# Patient Record
Sex: Male | Born: 1985 | Race: Black or African American | Hispanic: No | Marital: Single
Health system: Southern US, Community
[De-identification: ages and names within clinical notes are randomized; demographics above are authoritative.]

## PROBLEM LIST (undated history)

## (undated) DIAGNOSIS — Z889 Allergy status to unspecified drugs, medicaments and biological substances status: Secondary | ICD-10-CM

## (undated) DIAGNOSIS — K219 Gastro-esophageal reflux disease without esophagitis: Secondary | ICD-10-CM

## (undated) DIAGNOSIS — J40 Bronchitis, not specified as acute or chronic: Secondary | ICD-10-CM

## (undated) HISTORY — PX: ADENOIDECTOMY: SUR15

## (undated) HISTORY — PX: HERNIA REPAIR: SHX51

## (undated) HISTORY — PX: OTHER SURGICAL HISTORY: SHX169

---

## 2012-03-03 ENCOUNTER — Emergency Department (HOSPITAL_COMMUNITY): Payer: Self-pay

## 2012-03-03 ENCOUNTER — Emergency Department (HOSPITAL_COMMUNITY)
Admission: EM | Admit: 2012-03-03 | Discharge: 2012-03-03 | Disposition: A | Discharge status: Home / Self Care | Payer: Self-pay | Attending: Emergency Medicine | Admitting: Emergency Medicine

## 2012-03-03 ENCOUNTER — Encounter (HOSPITAL_COMMUNITY): Payer: Self-pay | Admitting: *Deleted

## 2012-03-03 DIAGNOSIS — R0602 Shortness of breath: Secondary | ICD-10-CM | POA: Insufficient documentation

## 2012-03-03 DIAGNOSIS — T754XXA Electrocution, initial encounter: Secondary | ICD-10-CM | POA: Insufficient documentation

## 2012-03-03 DIAGNOSIS — W868XXA Exposure to other electric current, initial encounter: Secondary | ICD-10-CM | POA: Insufficient documentation

## 2012-03-03 HISTORY — DX: Bronchitis, not specified as acute or chronic: J40

## 2012-03-03 LAB — BASIC METABOLIC PANEL
BUN: 11 mg/dL (ref 6–23)
Chloride: 102 mEq/L (ref 96–112)
Creatinine, Ser: 1.27 mg/dL (ref 0.50–1.35)
GFR calc Af Amer: 88 mL/min — ABNORMAL LOW (ref >90)
Glucose, Bld: 102 mg/dL — ABNORMAL HIGH (ref 70–99)
Potassium: 3.8 mEq/L (ref 3.5–5.1)

## 2012-03-03 MED ORDER — SODIUM CHLORIDE 0.9 % IV BOLUS (SEPSIS)
1000.0000 mL | Freq: Once | INTRAVENOUS | Status: AC
  Administered 2012-03-03: 1000 mL via INTRAVENOUS

## 2012-03-03 MED ORDER — NAPROXEN 500 MG PO TABS: 500.0000 mg | ORAL_TABLET | Freq: Two times a day (BID) | ORAL | Status: AC

## 2012-03-03 MED ORDER — LORAZEPAM 1 MG PO TABS: 1.0000 mg | ORAL_TABLET | Freq: Three times a day (TID) | ORAL | Status: AC | PRN

## 2012-03-03 NOTE — Discharge Instructions (Signed)
Electric Shock Injury  Electric shock injuries may be caused by lightning or electricity (current) passing through the body. The amount of injury depends on the current's pressure (voltage), the amount of current (amperage), the type of current (direct vs. alternating), the body's resistance to the current, the current's path through the body, and how long the body remains in contact with the current. Current is the flow of electricity. Electricity may produce effects ranging from barely noticeable tingling to instant death; every part of the body is vulnerable.   The harshness of injury depends mostly on the voltage. Low voltage can be as dangerous as high voltage under the right circumstances. People have been killed by shocks of just 50 volts.  WHAT DETERMINES THE EFFECTS OF ELECTRICITY?  How electric shocks affect the skin is determined by the skin's resistance. This is the skin's ability to stay unharmed by a shock. This, in turn, depends upon the wetness, dryness, thickness and or cleanliness of the skin. Thin or wet skin is much less resistant than thick or dry skin. When skin resistance is low, the current may cause little or no skin damage but may severely burn internal organs and tissues. Conversely, high skin resistance, such as with dry thick skin, can produce severe skin burns but decreases the current entering the body.  WHAT PARTS OF THE BODY DOES ELECTRICITY AFFECT THE MOST?   The nervous system (the brain, spinal cord, and nerves) are most helpless to the effects of electricity and most often harmed in electrical injury. Some damage is minor and clears up on its own or with treatment. Sometimes the damage is severe and will be permanent. Neurological problems may be apparent immediately after the accident, or gradually develop over a period of up to three years.   Damage to the respiratory and cardiovascular systems happens immediately. Electric shocks can paralyze the respiratory system (stop  breathing) or disrupt heart action (cause the heart to beat irregularly or stop). This may cause instant death. Smaller veins and arteries, which get hot more easily than the larger blood vessels, are at greater risk. They can develop blood clots. Damage to the smaller vessels is a common cause of amputation following high-voltage injuries.   Other injuries may include cataracts, kidney failure, and injury to muscle tissue. An electric arc may set clothing and flammable substances on fire which may cause burns. Strong shocks are often accompanied by violent muscle spasms that can break and dislocate bones. These spasms can also freeze the victim in place and prevent him or her from breaking away from the current.  DIAGNOSIS   Diagnosis relies on information about the cause of the accident, physical examination, and close monitoring of the heart, lungs, neurological condition and kidney activity. These conditions can change rapidly so close observation is necessary. Magnetic resonance imaging (MRI) may be necessary to check for brain injury.  TREATMENT    When an electrical accident happens at home or in the workplace, emergency medical help should be summoned as quickly as possible. The main power should immediately be shut off. If that cannot be done, and current is still flowing through the victim, stand on a dry, non-conducting surface such as a folded newspaper, flattened cardboard carton, or plastic or rubber mat. Use a non-conducting object such as a wooden broomstick (never a damp or metallic object) to push the victim away from the source of the current. Non-conducting means the substance will not pass electricity easily through it. Do not touch   the victim or electrical source while the current is still flowing. This may electrocute the rescuer.   If the victim is faint, pale, or showing signs of shock, lay the victim down, with the legs elevated above the level of the chest. Warm the person with a  blanket.   If a pulse can not be felt, or the person is not breathing, someone trained in cardiopulmonary resuscitation (CPR) should begin CPR. Continue this until help arrives.   If the victim is burned, remove clothing that comes off easily. Rinse the burned area in cool water for pain relief. Give first aid for burns. Burns often require treatment at a burn center.   Electrical injury can be associated with explosions or falls that can cause other injuries. Avoid moving the head or neck if an injury to this area is suspected.   Give first aid as needed for other wounds or fractures.   Fluid replacement therapy is necessary to restore lost fluids and electrolytes. Severely injured tissue is repaired surgically.   Antibiotics and antibacterial creams are used to prevent infection.   Kidney failure may need to be treated.   Physical therapy may help recovery along with counseling if there is disfigurement.  PROGNOSIS    Electric shocks may cause death.   Survivors may require amputation. Cosmetic problems may result along with disfigurement.   Injuries from household appliances and other low-voltage sources are less likely to produce extreme damage.  PREVENTION    Know electrical dangers in your home.   Damaged electric appliances, wiring, cords, and plugs should be repaired or replaced. Electrical repairs should be attempted only by people with the proper training.   Hair dryers, radios, and other electric appliances should never be used in the bathroom or anywhere else they might accidentally come in contact with water. Water and pipes create a ground and the electricity picks the easiest way to go to ground which can be through your body.   Young children need to be kept away from electric appliances and should be taught about the dangers of electricity as soon as they are old enough.   Electric outlets require safety covers in homes with young children.   During lightning and thunder storms, go  indoors immediately, even if no rain is falling. Boaters should return to shore as rapidly as possible.   If the hair on your head or arms stands on end during a storm, seek cover as rapidly as possible as a lightning strike may be about to happen.   If you cannot reach indoor shelter, stay away from metallic objects such as golf clubs or fishing rods and lie down in low-ground areas. Standing or lying under or next to tall or metallic structures is unsafe. For example, it is unsafe to stand under a tree during a lightning storm. Do not stand next to long conductors of electricity such as wire fences.   An automobile is appropriate cover, as long as the radio is off.   Telephones, computers, hair dryers, and other appliances that can act as channels for lightning should not be used during a thunder storm.   During storms, stay away from screens and metal that may conduct electricity from the outside.  SEEK IMMEDIATE MEDICAL CARE IF:   You develop chest pain.   A part of your arms or legs becomes very swollen or painful.   One of your arms or legs appears pale, cool, or discolored.   Your urine becomes discolored,   or you are not urinating as much as usual.   You develop severe abdominal pain.  Document Released: 10/06/2003 Document Revised: 09/22/2011 Document Reviewed: 11/06/2008  [employer] Patient Information 2012 [employer].

## 2012-03-03 NOTE — ED Notes (Signed)
Sent ISTAT to lab by accident.  Spoke with lab, they are changing it to a bmet.
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
## 2013-09-29 ENCOUNTER — Encounter (HOSPITAL_COMMUNITY): Payer: Self-pay | Admitting: Emergency Medicine

## 2013-09-29 ENCOUNTER — Emergency Department (HOSPITAL_COMMUNITY)
Admission: EM | Admit: 2013-09-29 | Discharge: 2013-09-29 | Disposition: A | Discharge status: Home / Self Care | Payer: No Typology Code available for payment source | Attending: Emergency Medicine | Admitting: Emergency Medicine

## 2013-09-29 DIAGNOSIS — J069 Acute upper respiratory infection, unspecified: Secondary | ICD-10-CM | POA: Insufficient documentation

## 2013-09-29 DIAGNOSIS — R51 Headache: Secondary | ICD-10-CM | POA: Insufficient documentation

## 2013-09-29 DIAGNOSIS — K219 Gastro-esophageal reflux disease without esophagitis: Secondary | ICD-10-CM | POA: Insufficient documentation

## 2013-09-29 DIAGNOSIS — Z79899 Other long term (current) drug therapy: Secondary | ICD-10-CM | POA: Insufficient documentation

## 2013-09-29 DIAGNOSIS — J3489 Other specified disorders of nose and nasal sinuses: Secondary | ICD-10-CM | POA: Diagnosis present

## 2013-09-29 DIAGNOSIS — Z8709 Personal history of other diseases of the respiratory system: Secondary | ICD-10-CM | POA: Insufficient documentation

## 2013-09-29 DIAGNOSIS — R599 Enlarged lymph nodes, unspecified: Secondary | ICD-10-CM | POA: Insufficient documentation

## 2013-09-29 HISTORY — DX: Gastro-esophageal reflux disease without esophagitis: K21.9

## 2013-09-29 MED ORDER — OXYCODONE-ACETAMINOPHEN 5-325 MG PO TABS: 1.0000 | ORAL_TABLET | Freq: Three times a day (TID) | ORAL | Status: DC | PRN

## 2013-09-29 NOTE — ED Provider Notes (Signed)
MSE was initiated and I personally evaluated the patient and placed orders (if any) at  6:45 AM on September 29, 2013.  The patient appears stable so that the remainder of the MSE may be completed by another provider.  Darlys Gales, MD 09/29/13 [phone]

## 2013-09-29 NOTE — ED Notes (Signed)
Patient presents with c/o right side facial pain that has been going on for about 1 month.  States he thinks it is his sinus

## 2013-09-29 NOTE — ED Provider Notes (Signed)
CSN: [id number]     Arrival date & time 09/29/13  0559 History   First MD Initiated Contact with Patient 09/29/13 [phone]     Chief Complaint  Patient presents with  . Facial Pain   (Consider location/radiation/quality/duration/timing/severity/associated sxs/prior Treatment) Patient is a 27 y.o. male presenting with URI. The history is provided by the patient.  URI Presenting symptoms: congestion   Presenting symptoms: no cough, no fever and no rhinorrhea   Severity:  Moderate Onset quality:  Sudden Duration:  4 days Timing:  Constant Progression:  Unchanged Chronicity:  New Relieved by:  Nothing Worsened by:  Nothing tried Ineffective treatments: sudafed. Associated symptoms: sinus pain   Associated symptoms: no headaches and no neck pain     Past Medical History  Diagnosis Date  . Bronchitis   . GERD (gastroesophageal reflux disease)    Past Surgical History  Procedure Laterality Date  . Hernia repair    . Tubes in ears    . Adenoidectomy     History reviewed. No pertinent family history. History  Substance Use Topics  . Smoking status: Passive Smoke Exposure - Never Smoker    Types: Cigarettes  . Smokeless tobacco: Never Used  . Alcohol Use: Yes     Comment: socially    Review of Systems  Constitutional: Negative for fever.  HENT: Positive for congestion. Negative for drooling and rhinorrhea.   Eyes: Negative for pain.  Respiratory: Negative for cough and shortness of breath.   Cardiovascular: Negative for chest pain and leg swelling.  Gastrointestinal: Negative for nausea, vomiting, abdominal pain and diarrhea.  Genitourinary: Negative for dysuria and hematuria.  Musculoskeletal: Negative for gait problem and neck pain.  Skin: Negative for color change.  Neurological: Negative for numbness and headaches.  Hematological: Negative for adenopathy.  Psychiatric/Behavioral: Negative for behavioral problems.  All other systems reviewed and are  negative.    Allergies  Shellfish allergy  Home Medications   Current Outpatient Rx  Name  Route  Sig  Dispense  Refill  . cetirizine (ZYRTEC) 10 MG tablet   Oral   Take 10 mg by mouth daily as needed for allergies.         Marland Kitchen oxyCODONE-acetaminophen (PERCOCET) 5-325 MG per tablet   Oral   Take 1 tablet by mouth every 8 (eight) hours as needed for moderate pain.   5 tablet   0    BP 164/68  Pulse 76  Temp(Src) 98.3 F (36.8 C) (Oral)  Resp 18  Ht 6\' 3"  (1.905 m)  Wt 230 lb (104.327 kg)  BMI 28.75 kg/m2  SpO2 100% Physical Exam  Nursing note and vitals reviewed. Constitutional: He is oriented to person, place, and time. He appears well-developed and well-nourished.  HENT:  Head: Normocephalic and atraumatic.  Right Ear: External ear normal.  Left Ear: External ear normal.  Nose: Nose normal.  Mouth/Throat: Oropharynx is clear and moist. No oropharyngeal exudate.  Normal appearing posterior oropharynx.  Mild bilateral anterior cervical adenopathy.  Mild tenderness to palpation of the right maxillary sinus.  Normal appearing dentition without periapical or perioral abscess noted.  Eyes: Conjunctivae and EOM are normal. Pupils are equal, round, and reactive to light.  Neck: Normal range of motion. Neck supple.  Cardiovascular: Normal rate, regular rhythm, normal heart sounds and intact distal pulses.  Exam reveals no gallop and no friction rub.   No murmur heard. Pulmonary/Chest: Effort normal and breath sounds normal. No respiratory distress. He has no wheezes.  Abdominal: Soft.  Bowel sounds are normal. He exhibits no distension. There is no tenderness. There is no rebound and no guarding.  Musculoskeletal: Normal range of motion. He exhibits no edema and no tenderness.  Neurological: He is alert and oriented to person, place, and time.  Skin: Skin is warm and dry.  Psychiatric: He has a normal mood and affect. His behavior is normal.    ED Course  Procedures  (including critical care time) Labs Review Labs Reviewed - No data to display Imaging Review No results found.  EKG Interpretation   None       MDM   1. URI (upper respiratory infection)   2. Sinus pain    7:15 AM 27 y.o. male who presents with nasal congestion and sinus pain for 4 days. He denies any fever, vomiting, diarrhea. He appears well on exam. He is complaining of right maxillary sinus pain. Will recommend over-the-counter therapies and will provide several tablets of Percocet so the patient can sleep at night.  7:17 AM:  I have discussed the diagnosis/risks/treatment options with the patient and believe the pt to be eligible for discharge home to follow-up with pcp as needed. We also discussed returning to the ED immediately if new or worsening sx occur. We discussed the sx which are most concerning (e.g., worsening pain, fever) that necessitate immediate return. Any new prescriptions provided to the patient are listed below.  New Prescriptions   OXYCODONE-ACETAMINOPHEN (PERCOCET) 5-325 MG PER TABLET    Take 1 tablet by mouth every 8 (eight) hours as needed for moderate pain.       Junius Argyle, MD 09/29/13 [phone]

## 2013-10-06 DIAGNOSIS — R599 Enlarged lymph nodes, unspecified: Secondary | ICD-10-CM | POA: Insufficient documentation

## 2013-10-06 DIAGNOSIS — Z8719 Personal history of other diseases of the digestive system: Secondary | ICD-10-CM | POA: Insufficient documentation

## 2013-10-06 DIAGNOSIS — R51 Headache: Secondary | ICD-10-CM | POA: Insufficient documentation

## 2013-10-06 DIAGNOSIS — Z8709 Personal history of other diseases of the respiratory system: Secondary | ICD-10-CM | POA: Insufficient documentation

## 2013-10-06 DIAGNOSIS — J3489 Other specified disorders of nose and nasal sinuses: Secondary | ICD-10-CM | POA: Insufficient documentation

## 2013-10-07 ENCOUNTER — Emergency Department (HOSPITAL_COMMUNITY): Payer: No Typology Code available for payment source

## 2013-10-07 ENCOUNTER — Emergency Department (HOSPITAL_COMMUNITY)
Admission: EM | Admit: 2013-10-07 | Discharge: 2013-10-07 | Disposition: A | Discharge status: Home / Self Care | Payer: No Typology Code available for payment source | Attending: Emergency Medicine | Admitting: Emergency Medicine

## 2013-10-07 ENCOUNTER — Encounter (HOSPITAL_COMMUNITY): Payer: Self-pay | Admitting: Emergency Medicine

## 2013-10-07 MED ORDER — OXYCODONE-ACETAMINOPHEN 5-325 MG PO TABS: 1.0000 | ORAL_TABLET | ORAL | Status: DC | PRN

## 2013-10-07 MED ORDER — AMOXICILLIN-POT CLAVULANATE 875-125 MG PO TABS: 1.0000 | ORAL_TABLET | Freq: Two times a day (BID) | ORAL | Status: DC

## 2013-10-07 MED ORDER — OXYCODONE-ACETAMINOPHEN 5-325 MG PO TABS
1.0000 | ORAL_TABLET | Freq: Once | ORAL | Status: AC
  Administered 2013-10-07: 1 via ORAL
  Filled 2013-10-07: qty 1

## 2013-10-07 NOTE — ED Notes (Signed)
Pt. reports right facial pain / sinus pressure for several days , seen here 09/29/2013 prescribed with pain medication with no relief.

## 2013-10-07 NOTE — ED Provider Notes (Signed)
CSN: [id number]     Arrival date & time 10/06/13  2346 History   None    Chief Complaint  Patient presents with  . Facial Pain   (Consider location/radiation/quality/duration/timing/severity/associated sxs/prior Treatment) HPI History provided by pt.   Pt has had gradually worsening pain in the right side of his face for the past two weeks.  Currently intolerable.  Aggravated by chewing on right side as well as palpation.  Associated w/ mild nasal congestion.  Denies fever, sore throat, edema.   Past Medical History  Diagnosis Date  . Bronchitis   . GERD (gastroesophageal reflux disease)    Past Surgical History  Procedure Laterality Date  . Hernia repair    . Tubes in ears    . Adenoidectomy     No family history on file. History  Substance Use Topics  . Smoking status: Passive Smoke Exposure - Never Smoker    Types: Cigarettes  . Smokeless tobacco: Never Used  . Alcohol Use: Yes     Comment: socially    Review of Systems  All other systems reviewed and are negative.    Allergies  Shellfish allergy  Home Medications   Current Outpatient Rx  Name  Route  Sig  Dispense  Refill  . cetirizine (ZYRTEC) 10 MG tablet   Oral   Take 10 mg by mouth daily as needed for allergies.         Marland Kitchen oxyCODONE-acetaminophen (PERCOCET) 5-325 MG per tablet   Oral   Take 1 tablet by mouth every 8 (eight) hours as needed for moderate pain.   5 tablet   0    BP 140/74  Pulse 76  Temp(Src) 97.5 F (36.4 C) (Oral)  Resp 18  SpO2 100% Physical Exam  Nursing note and vitals reviewed. Constitutional: He is oriented to person, place, and time. He appears well-developed and well-nourished. No distress.  HENT:  Head: Normocephalic and atraumatic.  Nml dentition w/ exception of tenderness of R upper 1st and 2nd premolars.  Injection of tonsils and posterior pharynx w/out exudate.  Uvula mid-line.  No trismus.  No submandibular edema.  Tenderness of entire R maxilla and mandible,  including TMJ.  Slight edema inferior to R eye.  No skin changes.   Eyes:  Normal appearance  Neck: Normal range of motion.  Pulmonary/Chest: Effort normal.  Musculoskeletal: Normal range of motion.  Lymphadenopathy:    He has cervical adenopathy.  Neurological: He is alert and oriented to person, place, and time.  Psychiatric: He has a normal mood and affect. His behavior is normal.    ED Course  Procedures (including critical care time) Labs Review Labs Reviewed - No data to display Imaging Review Ct Maxillofacial Wo Cm  10/07/2013   CLINICAL DATA:  Right facial pain and sinus pressure for several days.  EXAM: CT MAXILLOFACIAL WITHOUT CONTRAST  TECHNIQUE: Multidetector CT imaging of the maxillofacial structures was performed. Multiplanar CT image reconstructions were also generated. A small metallic BB was placed on the right temple in order to reliably differentiate right from left.  COMPARISON:  None.  FINDINGS: There is no evidence of fracture or dislocation. The maxilla and mandible appear intact. The nasal bone is unremarkable in appearance. Dental caries are seen at the right second maxillary premolar and left first maxillary premolar.  The orbits are intact bilaterally. A mucus retention cyst or polyp is noted within the left maxillary sinus. The remaining visualized paranasal sinuses and mastoid air cells are well-aerated.  No  significant soft tissue abnormalities are seen. The parapharyngeal fat planes are preserved. The nasopharynx, oropharynx and hypopharynx are unremarkable in appearance. The visualized portions of the valleculae and piriform sinuses are grossly unremarkable.  The parotid and submandibular glands are within normal limits. No cervical lymphadenopathy is seen. The visualized portions of the brain are unremarkable in appearance.  IMPRESSION: 1. No evidence of fracture or dislocation. 2. Dental caries noted at the right second maxillary premolar and the left first  maxillary premolar. 3. Mucus retention cyst or polyp within the left maxillary sinus.   Electronically Signed   By: Roanna Raider M.D.   On: 10/07/2013 02:40    EKG Interpretation   None       MDM   1. Facial pain    27yo healthy M presents w/ 2 weeks gradually worsening, non-traumatic R facial pain.  Was evaluated in ED on 10/01/13, diagnosed w/ sinusitis and d/c'd home w/ percocet.  Returns today because pain severe.  On exam, afebrile, uncomfortable appearing, Nml appearance 1st and 2nd right upper premolars but both ttp, tenderness of R maxilla and mandible and cervical lymphadenopathy.  Because of course of sx and b/c it is unclear whether pain is originating at sinus or dentition, CT maxillofacial ordered.  Pt has h/o dental abscess.  He has received one percocet for pain.  1:29 AM   CT shows dental caries right upper second premolar.  This may be source of pain.  Prescribed augmentin to cover both dental infection and possible sinusitis.  He also received 12 more percocet and I recommended sudafed and motrin.  Referred to dentist on call.  Return precautions discussed.    Otilio Miu, PA-C 10/07/13 [phone]

## 2013-10-11 NOTE — ED Provider Notes (Signed)
Medical screening examination/treatment/procedure(s) were performed by non-physician practitioner and as supervising physician I was immediately available for consultation/collaboration.     Brandt Loosen, MD 10/11/13 [phone]

## 2014-01-11 ENCOUNTER — Emergency Department (INDEPENDENT_AMBULATORY_CARE_PROVIDER_SITE_OTHER)
Admission: EM | Admit: 2014-01-11 | Discharge: 2014-01-11 | Disposition: A | Discharge status: Home / Self Care | Payer: No Typology Code available for payment source | Source: Home / Self Care | Attending: Emergency Medicine | Admitting: Emergency Medicine

## 2014-01-11 ENCOUNTER — Encounter (HOSPITAL_COMMUNITY): Payer: Self-pay | Admitting: Emergency Medicine

## 2014-01-11 DIAGNOSIS — J029 Acute pharyngitis, unspecified: Secondary | ICD-10-CM

## 2014-01-11 LAB — POCT RAPID STREP A: STREPTOCOCCUS, GROUP A SCREEN (DIRECT): NEGATIVE

## 2014-01-11 MED ORDER — METHYLPREDNISOLONE ACETATE 80 MG/ML IJ SUSP
INTRAMUSCULAR | Status: AC
  Filled 2014-01-11: qty 1

## 2014-01-11 MED ORDER — METHYLPREDNISOLONE ACETATE 80 MG/ML IJ SUSP
80.0000 mg | Freq: Once | INTRAMUSCULAR | Status: AC
  Administered 2014-01-11: 80 mg via INTRAMUSCULAR

## 2014-01-11 MED ORDER — AMOXICILLIN 500 MG PO TABS: 500.0000 mg | ORAL_TABLET | Freq: Two times a day (BID) | ORAL | Status: DC

## 2014-01-11 MED ORDER — IBUPROFEN 800 MG PO TABS: 800.0000 mg | ORAL_TABLET | Freq: Three times a day (TID) | ORAL | Status: DC | PRN

## 2014-01-11 NOTE — Discharge Instructions (Signed)
Pharyngitis °Pharyngitis is a sore throat (pharynx). There is redness, pain, and swelling of your throat. °HOME CARE  °· Drink enough fluids to keep your pee (urine) clear or pale yellow. °· Only take medicine as told by your doctor. °· You may get sick again if you do not take medicine as told. Finish your medicines, even if you start to feel better. °· Do not take aspirin. °· Rest. °· Rinse your mouth (gargle) with salt water (½ tsp of salt per 1 qt of water) every 1 2 hours. This will help the pain. °· If you are not at risk for choking, you can suck on hard candy or sore throat lozenges. °GET HELP IF: °· You have large, tender lumps on your neck. °· You have a rash. °· You cough up green, yellow-brown, or bloody spit. °GET HELP RIGHT AWAY IF:  °· You have a stiff neck. °· You drool or cannot swallow liquids. °· You throw up (vomit) or are not able to keep medicine or liquids down. °· You have very bad pain that does not go away with medicine. °· You have problems breathing (not from a stuffy nose). °MAKE SURE YOU:  °· Understand these instructions. °· Will watch your condition. °· Will get help right away if you are not doing well or get worse. °Document Released: 03/21/2008 Document Revised: 07/24/2013 Document Reviewed: 06/10/2013 °[employer]® Patient Information ©2014 [employer]. ° °

## 2014-01-11 NOTE — ED Provider Notes (Signed)
Medical screening examination/treatment/procedure(s) were performed by a resident physician and as supervising physician I was immediately available for consultation/collaboration.  Leslee Homeavid Arin Vanosdol, M.D.  Reuben Likesavid C Katana Berthold, MD 01/11/14 2149

## 2014-01-11 NOTE — ED Provider Notes (Signed)
CSN: [id number]     Arrival date & time 01/11/14  1905 History   First MD Initiated Contact with Patient 01/11/14 2010     No chief complaint on file.  (Consider location/radiation/quality/duration/timing/severity/associated sxs/prior Treatment) HPI Patient is a 28yo M presenting with URI-like symptoms. He reports throat pain and swelling. Decreased appetite. No fevers at home. Started one week ago with throat discomfort, progressively worsening. No known sick contacts. Reports productive cough (clear phlegm), nasal congestion, sneezing. Has history of seasonal allergies, takes claritin. No SOB, able to swallow liquids. No lip or tongue swelling.   Past Medical History  Diagnosis Date  . Bronchitis   . GERD (gastroesophageal reflux disease)    Past Surgical History  Procedure Laterality Date  . Hernia repair    . Tubes in ears    . Adenoidectomy     No family history on file. History  Substance Use Topics  . Smoking status: Passive Smoke Exposure - Never Smoker    Types: Cigarettes  . Smokeless tobacco: Never Used  . Alcohol Use: Yes     Comment: socially    Review of Systems  Constitutional: Negative for fever and chills.  HENT: Positive for congestion, postnasal drip, sneezing and sore throat.   Eyes: Negative for visual disturbance.  Respiratory: Positive for cough. Negative for shortness of breath.   Cardiovascular: Negative for chest pain and leg swelling.  Gastrointestinal: Negative for abdominal pain.  Genitourinary: Negative for dysuria.  Musculoskeletal: Negative for arthralgias and myalgias.  Skin: Negative for rash.  Neurological: Negative for headaches.    Allergies  Bee venom and Shellfish allergy  Home Medications   Current Outpatient Rx  Name  Route  Sig  Dispense  Refill  . amoxicillin (AMOXIL) 500 MG tablet   Oral   Take 1 tablet (500 mg total) by mouth 2 (two) times daily.   20 tablet   0   . amoxicillin-clavulanate (AUGMENTIN) 875-125 MG per  tablet   Oral   Take 1 tablet by mouth every 12 (twelve) hours.   14 tablet   0   . cetirizine (ZYRTEC) 10 MG tablet   Oral   Take 10 mg by mouth daily as needed for allergies.         Marland Kitchen. ibuprofen (ADVIL,MOTRIN) 800 MG tablet   Oral   Take 1 tablet (800 mg total) by mouth every 8 (eight) hours as needed.   30 tablet   0   . oxyCODONE-acetaminophen (PERCOCET) 5-325 MG per tablet   Oral   Take 1 tablet by mouth every 8 (eight) hours as needed for moderate pain.   5 tablet   0   . oxyCODONE-acetaminophen (PERCOCET/ROXICET) 5-325 MG per tablet   Oral   Take 1 tablet by mouth every 4 (four) hours as needed for severe pain.   12 tablet   0    BP 152/69  Pulse 85  Temp(Src) 99.3 F (37.4 C) (Oral)  Resp 18 Physical Exam  Constitutional: He is oriented to person, place, and time. He appears well-developed and well-nourished. No distress.  HENT:  Head: Normocephalic and atraumatic.  Mouth/Throat: Oropharyngeal exudate (Posterior pharynx very erythematous with edema. 2+ tonsils with white exudate on right) present.  Eyes: Pupils are equal, round, and reactive to light.  Neck: Normal range of motion. Neck supple.  Cardiovascular: Normal rate, regular rhythm and normal heart sounds.   No murmur heard. Pulmonary/Chest: Effort normal and breath sounds normal. He has no wheezes.  Abdominal: Soft.  He exhibits no distension. There is no tenderness.  Musculoskeletal: Normal range of motion. He exhibits no edema and no tenderness.  Neurological: He is alert and oriented to person, place, and time.  Skin: Skin is warm and dry.  Psychiatric: He has a normal mood and affect.    ED Course  Procedures (including critical care time) Labs Review Labs Reviewed  POCT RAPID STREP A ([facility] ONLY)   Imaging Review No results found.  MDM   1. Pharyngitis    Strep test negative. Patient with significant discomfort due to swelling and erythema - Depomedrol 80mg  given in  clinic - Ibuprofen 800mg  Rx  - Amox 500mg  BID Rx given to patient with the option to fill it if he does not get relief with steroid - F/u if anything worsens, or if he fails to improve    Hilarie Fredrickson, MD 01/11/14 2042

## 2014-01-11 NOTE — ED Notes (Signed)
Sore throat onset Tues. Has a little runny nose and coughing.  No fever noted.

## 2014-01-13 LAB — CULTURE, GROUP A STREP

## 2014-03-14 ENCOUNTER — Encounter (HOSPITAL_COMMUNITY): Payer: Self-pay | Admitting: Emergency Medicine

## 2014-03-14 ENCOUNTER — Emergency Department (HOSPITAL_COMMUNITY)
Admission: EM | Admit: 2014-03-14 | Discharge: 2014-03-14 | Disposition: A | Discharge status: Home / Self Care | Payer: No Typology Code available for payment source | Attending: Emergency Medicine | Admitting: Emergency Medicine

## 2014-03-14 DIAGNOSIS — K219 Gastro-esophageal reflux disease without esophagitis: Secondary | ICD-10-CM | POA: Insufficient documentation

## 2014-03-14 DIAGNOSIS — K130 Diseases of lips: Secondary | ICD-10-CM | POA: Insufficient documentation

## 2014-03-14 DIAGNOSIS — J309 Allergic rhinitis, unspecified: Secondary | ICD-10-CM | POA: Insufficient documentation

## 2014-03-14 DIAGNOSIS — Z79899 Other long term (current) drug therapy: Secondary | ICD-10-CM | POA: Insufficient documentation

## 2014-03-14 DIAGNOSIS — T7840XA Allergy, unspecified, initial encounter: Secondary | ICD-10-CM

## 2014-03-14 DIAGNOSIS — T478X5A Adverse effect of other agents primarily affecting gastrointestinal system, initial encounter: Secondary | ICD-10-CM | POA: Insufficient documentation

## 2014-03-14 MED ORDER — METHYLPREDNISOLONE SODIUM SUCC 125 MG IJ SOLR
125.0000 mg | Freq: Once | INTRAMUSCULAR | Status: AC
  Administered 2014-03-14: 125 mg via INTRAVENOUS
  Filled 2014-03-14: qty 2

## 2014-03-14 MED ORDER — FAMOTIDINE 20 MG PO TABS: 20.0000 mg | ORAL_TABLET | Freq: Two times a day (BID) | ORAL | Status: DC

## 2014-03-14 MED ORDER — FAMOTIDINE IN NACL 20-0.9 MG/50ML-% IV SOLN
20.0000 mg | Freq: Once | INTRAVENOUS | Status: AC
  Administered 2014-03-14: 20 mg via INTRAVENOUS
  Filled 2014-03-14: qty 50

## 2014-03-14 MED ORDER — EPINEPHRINE 0.3 MG/0.3ML IJ SOAJ
INTRAMUSCULAR | Status: AC
  Administered 2014-03-14: 0.3 mg
  Filled 2014-03-14: qty 0.3

## 2014-03-14 MED ORDER — DIPHENHYDRAMINE HCL 25 MG PO TABS: 25.0000 mg | ORAL_TABLET | Freq: Four times a day (QID) | ORAL | Status: DC | PRN

## 2014-03-14 MED ORDER — PREDNISONE 20 MG PO TABS: ORAL_TABLET | ORAL | Status: DC

## 2014-03-14 MED ORDER — DIPHENHYDRAMINE HCL 50 MG/ML IJ SOLN
25.0000 mg | Freq: Once | INTRAMUSCULAR | Status: AC
  Administered 2014-03-14: 25 mg via INTRAVENOUS
  Filled 2014-03-14: qty 1

## 2014-03-14 NOTE — ED Notes (Signed)
Given patient water for fluid challenge

## 2014-03-14 NOTE — ED Provider Notes (Signed)
Patient originally seen by Dr. Theodoro Grist - transfer of care from Dr. Theodoro Grist at change in shift.   Plan: Anticipate discharge.   Roberto Moody  is a 28 y/o M with PMHx of GERD and bronchitis presenting to the ED with an allergic reaction. Patient reported that he has been taking omeprazole OTC for the past week - 20 mg tablets daily. Reported that he took his last dose at 11:00AM - reported that approximately one hour after taking the medication he began to have upper lip swelling, mainly to the right upper aspect and dry mouth. Stated that he drank a lot of water and stated that he did not feel any different. Reported that he started to feel mildly short of breath and reported that he came to the ED. Patient reported that he was not aware that Prilosec and omeprazole were one in the same - reported that years ago he got short of breath and had a similar reaction with Prilosec. Denied nausea, vomiting, chest pain, difficulty breathing, tingling. Denied use of ACE inhibitors.   7:36 AM Alert and oriented. GCS 15. Negative signs of respiratory distress-negative use of accessory muscles, negative stridor. Patient stable to speak in full sentences without difficulty. Heart rate and rhythm normal. Pulses palpable and strong-radial and DP 2+ bilaterally. Mild swelling identified to the right aspect of the upper lip with negative erythema, inflammation, lesions sores, warmth upon palpation. Negative tongue swelling identified. Uvula midline with symmetrical elevation-negative uvula deviation or uvula swelling. Lungs clear to auscultation bilaterally.  10:54 AM Patient reassessed by this provider. Patient appears comfortable sitting upright in bed. Negative signs of respiratory distress, negative stridor, negative use of accessory muscles. Swelling to the upper lip has decreased, improved. Patient ambulated well, pulse ox 100% on room air-negative signs of respiratory distress upon motion. Patient able to  tolerate fluids by mouth without difficulty swallowing or emesis while in ED setting. Vitals monitored while in ED setting, appear unremarkable. Patient stable, afebrile. Patient not septic appearing. Patient appears well after IV medications administered. Discharged patient. Discharged patient with Benadryl, Pepcid, and Prednisone small burst. [facility]. Discussed with patient to discontinue both Prilosec and Omeprazole. Discussed with patient to rest and stay hydrated. Discussed with patient to avoid any physical strenuous activity. Discussed with patient if symptoms are to worsen or change such as shortness approximate difficulty breathing, swelling, throat closing sensation, tongue swelling to report back to emergency department immediately. Discussed with patient to closely monitor symptoms and if symptoms are to worsen or change to report back to the ED - strict return instructions given.  Patient agreed to plan of care, understood, all questions answered.   Medications  diphenhydrAMINE (BENADRYL) injection 25 mg (25 mg Intravenous Given 03/14/14 0647)  famotidine (PEPCID) IVPB 20 mg (0 mg Intravenous Stopped 03/14/14 0730)  methylPREDNISolone sodium succinate (SOLU-MEDROL) 125 mg/2 mL injection 125 mg (125 mg Intravenous Given 03/14/14 0645)  EPINEPHrine (EPI-PEN) 0.3 mg/0.3 mL injection (0.3 mg  Given 03/14/14 0633)   Filed Vitals:   03/14/14 0900 03/14/14 0929 03/14/14 0930 03/14/14 1000  BP: 124/50 136/50 136/50 118/57  Pulse: 67 79 71 77  Temp:      TempSrc:      Resp: 16 16 16 16   Height:      Weight:      SpO2: 98% 100% 100% 99%   Diagnoses that have been ruled out:  None  Diagnoses that are still under consideration:  None  Final diagnoses:  Allergic reaction     Raymon MuttonMarissa Alysandra Lobue, PA-C 03/14/14 1107  Gage Weant, PA-C 03/14/14 1108

## 2014-03-14 NOTE — ED Notes (Signed)
Patient tolerated fluid challenge without incident.

## 2014-03-14 NOTE — ED Notes (Signed)
Patient here with possible allergic reaction to Omeprazole. Patient states that he has had trouble with Prilosec in the past, but didn't realize that Prilosec and omeprazole were the same. States that his upper lip is swollen and throat feels sore.

## 2014-03-14 NOTE — Discharge Instructions (Signed)
Please call and set-up an appointment with Health and [place]  Please call and set-up an appointment with Allergy to get tested Please use medications as prescribed - while on Benadryl can cause drowsiness, please do not take when operating heavy machinery, driving, alcohol. Please no more than 100 mg per day.  Please rest and stay hydrated Please STOP Prilosec and Omeprazole Please continue to monitor symptoms closely and if symptoms are to worsen or change (fever greater than 101, chills, sweating, nausea, vomiting, chest pain, shortness of breath, difficulty breathing, numbness, tingling, tongue swelling, swelling to the face, throat closing sensation, blurred vision, sudden loss of vision, worsening or changes to symptoms, dizziness, light-headedness) please report back to the ED immediately     Drug Allergy Allergic reactions to medicines are common. Some allergic reactions are mild. A delayed type of drug allergy that occurs 1 week or more after exposure to a medicine or vaccine is called serum sickness. A life-threatening, sudden (acute) allergic reaction that involves the whole body is called anaphylaxis. CAUSES  "True" drug allergies occur when there is an allergic reaction to a medicine. This is caused by overactivity of the immune system. First, the body becomes sensitized. The immune system is triggered by your first exposure to the medicine. Following this first exposure, future exposure to the same medicine may be life-threatening. Almost any medicine can cause an allergic reaction. Common ones are:  Penicillin.  Sulfonamides (sulfa drugs).  Local anesthetics.  X-ray dyes that contain iodine. SYMPTOMS  Common symptoms of a minor allergic reaction are:  Swelling around the mouth.  An itchy red rash or hives.  Vomiting or diarrhea. Anaphylaxis can cause swelling of the mouth and throat. This makes it difficult to breathe and swallow. Severe reactions can be fatal  within seconds, even after exposure to only a trace amount of the drug that causes the reaction. HOME CARE INSTRUCTIONS   If you are unsure of what caused your reaction, keep a diary of foods and medicines used. Include the symptoms that followed. Avoid anything that causes reactions.  You may want to follow up with an allergy specialist after the reaction has cleared in order to be tested to confirm the allergy. It is important to confirm that your reaction is an allergy, not just a side effect to the medicine. If you have a true allergy to a medicine, this may prevent that medicine and related medicines from being given to you when you are very ill.  If you have hives or a rash:  Take medicines as directed by your caregiver.  You may use an over-the-counter antihistamine (diphenhydramine) as needed.  Apply cold compresses to the skin or take baths in cool water. Avoid hot baths or showers.  If you are severely allergic:  Continuous observation after a severe reaction may be needed. Hospitalization is often required.  Wear a medical alert bracelet or necklace stating your allergy.  You and your family must learn how to use an anaphylaxis kit or give an epinephrine injection to temporarily treat an emergency allergic reaction. If you have had a severe reaction, always carry your epinephrine injection or anaphylaxis kit with you. This can be lifesaving if you have a severe reaction.  Do not drive or perform tasks after treatment until the medicines used to treat your reaction have worn off, or until your caregiver says it is okay. SEEK MEDICAL CARE IF:   You think you had an allergic reaction. Symptoms usually start within 30  minutes after exposure.  Symptoms are getting worse rather than better.  You develop new symptoms.  The symptoms that brought you to your caregiver return. SEEK IMMEDIATE MEDICAL CARE IF:   You have swelling of the mouth, difficulty breathing, or  wheezing.  You have a tight feeling in your chest or throat.  You develop hives, swelling, or itching all over your body.  You develop severe vomiting or diarrhea.  You feel faint or pass out. This is an emergency. Use your epinephrine injection or anaphylaxis kit as you have been instructed. Call for emergency medical help. Even if you improve after the injection, you need to be examined at a hospital emergency department. MAKE SURE YOU:   Understand these instructions.  Will watch your condition.  Will get help right away if you are not doing well or get worse. Document Released: 10/03/2005 Document Revised: 12/26/2011 Document Reviewed: 03/09/2011 [facility] Patient Information 2014 [place], [place].   Emergency Department Resource Guide 1) Find a Doctor and Pay Out of Pocket Although you won't have to find out who is covered by your insurance plan, it is a good idea to ask around and get recommendations. You will then need to call the office and see if the doctor you have chosen will accept you as a new patient and what types of options they offer for patients who are self-pay. Some doctors offer discounts or will set up payment plans for their patients who do not have insurance, but you will need to ask so you aren't surprised when you get to your appointment.  2) Contact Your Local Health Department Not all health departments have doctors that can see patients for sick visits, but many do, so it is worth a call to see if yours does. If you don't know where your local health department is, you can check in your phone book. The CDC also has a tool to help you locate your state's health department, and many state websites also have listings of all of their local health departments.  3) Find a [facility] If your illness is not likely to be very severe or complicated, you may want to try a walk in clinic. These are popping up all over the country in pharmacies, drugstores, and shopping  centers. They're usually staffed by nurse practitioners or physician assistants that have been trained to treat common illnesses and complaints. They're usually fairly quick and inexpensive. However, if you have serious medical issues or chronic medical problems, these are probably not your best option.  No Primary Care Doctor: - Call Health Connect at  [phone] - they can help you locate a primary care doctor that  accepts your insurance, provides certain services, etc. - Physician Referral Service- [phone]  Chronic Pain Problems: Organization         Address  Phone   Notes  [facility]  [phone] Patients need to be referred by their primary care doctor.   Medication Assistance: Organization         Address  Phone   Notes  [facility] Medication [place] [facility] [address] [place], [place] [address] [phone] --Must be a resident of [facility] -- Must have NO insurance coverage whatsoever (no Medicaid/ Medicare, etc.) -- The pt. MUST have a primary care doctor that directs their care regularly and follows them in the community   MedAssist  [phone]   [employer]  [phone]    Agencies that  provide inexpensive medical care: Organization         Address  Phone   Notes  [place]  [phone]   [facility]    [phone]   [facility] [place], [place] [address] [phone]   [place] [address] [place] [phone]   Planned Parenthood    [phone]   [facility]    [phone]   [place] and [place] [address], [place] Phone:  [phone], Fax:  [phone] Hours of Operation:  9 am - 6 pm, M-F.  Also accepts Medicaid/Medicare and self-pay.  [facility] for [place], [address], [place] Phone: [phone], Fax: [phone]. Hours of Operation:  8:30 am - 5:30 pm, M-F.  Also accepts Medicaid and self-pay.  [facility] [place] [address] [place] Phone: [phone]   [place], [place], [place] [phone], Ext. [phone] Mondays & Thursdays: 7-9 AM.  First 15 patients are seen on a first come, first serve basis.    [place] Providers:  Organization         Address  Phone   Notes  [facility] [address], [place] [phone] Also accepts self-pay patients.  [facility] [address], [place]  [phone]   [place], [address], [place] [phone]   [facility] [address], [place] [phone]   Lucianne Lei [address], [place]   [phone] Only accepts [place] Access [place] patients after they have their name applied to their card.   Self-Pay (no insurance) in [facility]:  Organization         Address  Phone   Notes  Sickle Cell Patients, [facility] [place] [phone]   [facility] [place] [phone]   [facility] [place]  [place], [place], [place] [phone]   [facility]/Dr. Osei-Bonsu  [address] [place] or [address], [place] [phone] Phone number for both [place] and [place] locations is the same.  [facility] [address] [place] [phone]   [facility] [address] [place] or [address] [phone]   [facility] [address], [place] [phone], phone; [phone], fax Sees patients 1st and 3rd Saturday of every month.  Must not qualify for public or private insurance (i.e.  Medicaid, Medicare, [place] Health Choice, Veterans' Benefits)  Household income should be no more than 200% of the poverty level The clinic cannot treat you if you are pregnant or think you are pregnant  Sexually transmitted diseases are not treated at the clinic.    Dental Care: Organization         Address  Phone  Notes  [facility] Department of [facility] [place] [phone] Accepts children up to age 28 who are enrolled in [place] or [place]; pregnant women with a Medicaid card; and children who have applied for Medicaid or [place]  Choice, but were declined, whose parents can pay a reduced fee at time of service.  [facility] Department of [facility]  [address], [place] [phone] Accepts children up to age 46 who are enrolled in [place] or [place]; pregnant women with a Medicaid card; and children who have applied for Medicaid or [place] Health Choice, but were declined, whose parents can pay a reduced fee at time of service.  [place] Adult Dental Access PROGRAM  [place] [phone] Patients are seen by appointment only. Walk-ins are not accepted. [place] will see patients 59 years of age and older. Monday - Tuesday (8am-5pm) Most Wednesdays (8:30-5pm) $30 per visit, cash only  [facility] Adult Dental Access PROGRAM  [address], [facility] [phone] Patients are seen by appointment only. Walk-ins are not accepted. [place] will see patients 27 years of age and older. One Wednesday Evening (Monthly: Volunteer Based).  $30 per visit, cash only  [place]  [phone] for adults; Children under age 59, call Graduate Pediatric Dentistry at [phone]. Children aged 47-14, please call [phone] to request a pediatric application.  Dental services are provided in all areas of dental care including fillings,  crowns and bridges, complete and partial dentures, implants, gum treatment, root canals, and extractions. Preventive care is also provided. Treatment is provided to both adults and children. Patients are selected via a lottery and there is often a waiting list.   [facility] [address], [employer]  [phone] [email or web address]   [facility] [address] [place], [place] [phone], Ext. [phone] Second and Fourth Thursday of each month, opens at 6:30 AM; Clinic ends at 9 AM.  Patients are seen on a first-come first-served basis, and a limited number are seen during each clinic.   [facility]  [address] [place] D[place]er [place], [place] [phone]   Eligibility Requirements You must have lived in [place], [place], or [place] counties for at least the last three months.   You cannot be eligible for state or federal sponsored [employer], including [employer], [place], or [employer].   You generally cannot be eligible for healthcare insurance through your employer.    How to apply: Eligibility screenings are held every Tuesday and Wednesday afternoon from 1:00 pm until 4:00 pm. You do not need an appointment for the interview!  [facility] [address], [place], [place]   [place]  Center Department  [place]  [phone]    Behavioral Health Resources in the Community: Intensive Outpatient Programs Organization         Address  Phone  Notes  [place]. [address] [place], [place] [phone]   [facility] Outpatient [address] [place], [place]   ADS: Alcohol & Drug Svcs [address], [place], [place]   [place] [address]  [place], [place] or [phone]   Substance Abuse  Resources Organization         Address  Phone  Notes  Alcohol and Drug Services  [phone]   [facility]  [phone]   [facility] [place]  [phone]   Chinita Pester  [phone]   Residential & Outpatient Substance Abuse Program  [phone]  Psychological Services Organization         Address  Phone  Notes  [facility] [place]  [place]  [phone]   [place] [address], [place] or [phone]    Mobile Crisis Teams Organization         Address  Phone  Notes  [facility], Mobile Crisis Care Unit  [phone]   [facility]  [address] [place], [place]   [place] [address], [place] [phone]    Self-Help/Support Groups Organization         Address  Phone             Notes  [place]. of [place] - variety of support groups  [place] Call for more information  Narcotics Anonymous (NA), [facility]aring [facility] [address], [employer] [place]  2 meetings at this location   Special educational needs teacher         Address  Phone  Notes  [facility] Residential Treatment [place],    [place]  [phone]   [facility]  [address] [place] [address]86, [place], [place]   [place]irley [place], [place] [phone] Admissions: 8am-3pm M-F  Incentives Substance [place] [address]    [place], [place] [phone]   [facility] [address] [employer], [place], [place]   [facility] [address],  [place], [place]   [facility] Programs - Intensive Outpatient [address] Kristeen [address], [place], [place]   [facility] [place].,  [place], [place] [phone] or [phone]   [facility]idential Treatment Services (RTS) [address], [place], [place] Accepts Medicaid  [facility] [place] [address],  [place] [phone] Substance Abuse/Addiction Treatment   [facility] Organization         Address  Phone  Notes  [facility]  [phone]   Domenic Schwab, PhD [address] [place]lis [place], [place]   [phone] or [phone]   [place], [place] [phone]   [facility] [address] [place], [place] [phone] Insurance/Medicaid/sponsorship through [facility] and Families [address], [address] [place]                                    [place], [place] [phone] [place] [address], [place] [phone]    Dr. Adele Schilder  [phone]   [facility] Dept. 1) [address] [place] 2) [place] 3)  [place] [address], [place] [phone]  [phone]   [place] [phone] or [phone] (After Hours)

## 2014-03-14 NOTE — ED Notes (Signed)
Patient ambulated well in hall way without assistance. Pulse Ox levels prior to ambulation 100% and were unchanged during and at completion of ambulation.

## 2014-03-15 NOTE — ED Provider Notes (Signed)
Medical screening examination/treatment/procedure(s) were performed by non-physician practitioner and as supervising physician I was immediately available for consultation/collaboration.   EKG Interpretation None       Hurman Horn, MD 03/15/14 2224

## 2014-03-28 NOTE — ED Provider Notes (Signed)
CSN: [id number]     Arrival date & time 03/14/14  0456 History   First MD Initiated Contact with Patient 03/14/14 [phone]     Chief Complaint  Patient presents with  . Allergic Reaction     (Consider location/radiation/quality/duration/timing/severity/associated sxs/prior Treatment) HPI Comments: Pt with hx of allergic rxn to prilosec comes in with allergic rxn. Reports that he took omeprazole earlier today, and soon after started having swelling of upper lip, and tightness in his throat. Pt has no trouble swallowing, no drooling, and denies any din. No wheezing.  Patient is a 28 y.o. male presenting with allergic reaction. The history is provided by the patient.  Allergic Reaction Presenting symptoms: no rash     Past Medical History  Diagnosis Date  . Bronchitis   . GERD (gastroesophageal reflux disease)    Past Surgical History  Procedure Laterality Date  . Hernia repair    . Tubes in ears    . Adenoidectomy     Family History  Problem Relation Age of Onset  . Cancer Mother     lung   History  Substance Use Topics  . Smoking status: Passive Smoke Exposure - Never Smoker    Types: Cigarettes  . Smokeless tobacco: Never Used  . Alcohol Use: No    Review of Systems  Constitutional: Negative for activity change and appetite change.  Respiratory: Negative for cough and shortness of breath.   Cardiovascular: Negative for chest pain.  Gastrointestinal: Negative for abdominal pain.  Genitourinary: Negative for dysuria.  Skin: Negative for rash.  Allergic/Immunologic: Positive for environmental allergies.      Allergies  Bee venom and Shellfish allergy  Home Medications   Prior to Admission medications   Medication Sig Start Date End Date Taking? Authorizing Provider  cetirizine (ZYRTEC) 10 MG tablet Take 10 mg by mouth daily as needed for allergies.   Yes Historical Provider, MD  ibuprofen (ADVIL,MOTRIN) 800 MG tablet Take 1 tablet (800 mg total) by mouth every 8  (eight) hours as needed. 01/11/14  Yes Amber Nydia BoutonM [place], MD  diphenhydrAMINE (BENADRYL) 25 MG tablet Take 1 tablet (25 mg total) by mouth every 6 (six) hours as needed for allergies. 03/14/14   Marissa Sciacca, PA-C  famotidine (PEPCID) 20 MG tablet Take 1 tablet (20 mg total) by mouth 2 (two) times daily. 03/14/14   Marissa Sciacca, PA-C  predniSONE (DELTASONE) 20 MG tablet 3 tabs po day one, then 2 tabs daily x 4 days 03/14/14   Marissa Sciacca, PA-C   BP 126/68  Pulse 84  Temp(Src) 98.7 F (37.1 C) (Oral)  Resp 16  Ht 6\' 2"  (1.88 m)  Wt 200 lb (90.719 kg)  BMI 25.67 kg/m2  SpO2 100% Physical Exam  Nursing note and vitals reviewed. Constitutional: He is oriented to person, place, and time. He appears well-developed.  HENT:  Head: Normocephalic and atraumatic.  Mild upper lip swelling. Oral mucosa appears normal.  Eyes: Conjunctivae and EOM are normal. Pupils are equal, round, and reactive to light.  Neck: Normal range of motion. Neck supple.  Cardiovascular: Normal rate and regular rhythm.   Pulmonary/Chest: Effort normal and breath sounds normal.  Abdominal: Soft. Bowel sounds are normal. He exhibits no distension. There is no tenderness. There is no rebound and no guarding.  Neurological: He is alert and oriented to person, place, and time.  Skin: Skin is warm.    ED Course  Procedures (including critical care time) Labs Review Labs Reviewed - No data to  display  Imaging Review No results found.   EKG Interpretation None      MDM   Final diagnoses:  Allergic reaction    Pt comes in with cc of allergic reaction. Exam shows lip swelling. No airway compromise. Plan is to give some meds for acute allergic rxn and observe in the ER. Epi not required.    Derwood KaplanAnkit Rajan Burgard, MD 03/28/14 2311

## 2014-07-14 ENCOUNTER — Encounter (HOSPITAL_BASED_OUTPATIENT_CLINIC_OR_DEPARTMENT_OTHER): Payer: Self-pay | Admitting: Emergency Medicine

## 2014-07-14 ENCOUNTER — Emergency Department (HOSPITAL_BASED_OUTPATIENT_CLINIC_OR_DEPARTMENT_OTHER)
Admission: EM | Admit: 2014-07-14 | Discharge: 2014-07-14 | Disposition: A | Discharge status: Home / Self Care | Payer: No Typology Code available for payment source | Attending: Emergency Medicine | Admitting: Emergency Medicine

## 2014-07-14 DIAGNOSIS — IMO0002 Reserved for concepts with insufficient information to code with codable children: Secondary | ICD-10-CM | POA: Diagnosis not present

## 2014-07-14 DIAGNOSIS — Y92410 Unspecified street and highway as the place of occurrence of the external cause: Secondary | ICD-10-CM | POA: Diagnosis not present

## 2014-07-14 DIAGNOSIS — Z8709 Personal history of other diseases of the respiratory system: Secondary | ICD-10-CM | POA: Diagnosis not present

## 2014-07-14 DIAGNOSIS — M542 Cervicalgia: Secondary | ICD-10-CM

## 2014-07-14 DIAGNOSIS — Y9389 Activity, other specified: Secondary | ICD-10-CM | POA: Insufficient documentation

## 2014-07-14 DIAGNOSIS — S0990XA Unspecified injury of head, initial encounter: Secondary | ICD-10-CM | POA: Diagnosis not present

## 2014-07-14 DIAGNOSIS — Z8719 Personal history of other diseases of the digestive system: Secondary | ICD-10-CM | POA: Diagnosis not present

## 2014-07-14 DIAGNOSIS — R519 Headache, unspecified: Secondary | ICD-10-CM

## 2014-07-14 DIAGNOSIS — Z79899 Other long term (current) drug therapy: Secondary | ICD-10-CM | POA: Diagnosis not present

## 2014-07-14 DIAGNOSIS — S199XXA Unspecified injury of neck, initial encounter: Principal | ICD-10-CM

## 2014-07-14 DIAGNOSIS — R51 Headache: Secondary | ICD-10-CM

## 2014-07-14 DIAGNOSIS — S0993XA Unspecified injury of face, initial encounter: Secondary | ICD-10-CM | POA: Diagnosis not present

## 2014-07-14 MED ORDER — CYCLOBENZAPRINE HCL 10 MG PO TABS: 5.0000 mg | ORAL_TABLET | Freq: Two times a day (BID) | ORAL | Status: DC | PRN

## 2014-07-14 MED ORDER — ACETAMINOPHEN 325 MG PO TABS
650.0000 mg | ORAL_TABLET | Freq: Once | ORAL | Status: AC
  Administered 2014-07-14: 650 mg via ORAL
  Filled 2014-07-14: qty 2

## 2014-07-14 MED ORDER — OXYCODONE-ACETAMINOPHEN 5-325 MG PO TABS: 1.0000 | ORAL_TABLET | Freq: Four times a day (QID) | ORAL | Status: DC | PRN
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
## 2014-07-14 NOTE — ED Notes (Signed)
MD at bedside. 
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
## 2014-07-16 NOTE — ED Notes (Signed)
Patient's spouse called requesting a limited duty work note for the patient due to continued back pain.  Stated that the patient has an appointment tomorrow at Dr. Eulah PontMurphy & [facility] for followup.  Stated that the patient did not work today due to the pain.  Will give a note to be out of work today and will need to follow up with Ortho MD for a limited duty or continued absence from work note.

## 2014-08-07 ENCOUNTER — Other Ambulatory Visit: Payer: Self-pay | Admitting: Sports Medicine

## 2014-08-07 DIAGNOSIS — M545 Low back pain, unspecified: Secondary | ICD-10-CM

## 2014-08-18 ENCOUNTER — Other Ambulatory Visit: Payer: No Typology Code available for payment source

## 2015-02-21 ENCOUNTER — Emergency Department (HOSPITAL_COMMUNITY): Payer: No Typology Code available for payment source

## 2015-02-21 ENCOUNTER — Emergency Department (HOSPITAL_COMMUNITY)
Admission: EM | Admit: 2015-02-21 | Discharge: 2015-02-22 | Disposition: A | Discharge status: Home / Self Care | Payer: No Typology Code available for payment source | Attending: Emergency Medicine | Admitting: Emergency Medicine

## 2015-02-21 ENCOUNTER — Encounter (HOSPITAL_COMMUNITY): Payer: Self-pay | Admitting: Emergency Medicine

## 2015-02-21 DIAGNOSIS — B9789 Other viral agents as the cause of diseases classified elsewhere: Secondary | ICD-10-CM

## 2015-02-21 DIAGNOSIS — R0602 Shortness of breath: Secondary | ICD-10-CM | POA: Insufficient documentation

## 2015-02-21 DIAGNOSIS — J028 Acute pharyngitis due to other specified organisms: Secondary | ICD-10-CM

## 2015-02-21 DIAGNOSIS — J029 Acute pharyngitis, unspecified: Secondary | ICD-10-CM | POA: Insufficient documentation

## 2015-02-21 DIAGNOSIS — Z8719 Personal history of other diseases of the digestive system: Secondary | ICD-10-CM | POA: Insufficient documentation

## 2015-02-21 LAB — RAPID STREP SCREEN (MED CTR MEBANE ONLY): Streptococcus, Group A Screen (Direct): NEGATIVE

## 2015-02-21 MED ORDER — HYDROCODONE-HOMATROPINE 5-1.5 MG/5ML PO SYRP: 5.0000 mL | ORAL_SOLUTION | Freq: Four times a day (QID) | ORAL | Status: DC | PRN

## 2015-02-21 MED ORDER — AZITHROMYCIN 250 MG PO TABS: 250.0000 mg | ORAL_TABLET | Freq: Every day | ORAL | Status: DC

## 2015-02-21 NOTE — ED Notes (Signed)
Pt from home c/o shortness of breath and sore throat after getting food stuck in his throat approximately 30 minutes ago. Pt reports that for the past week he feels as if his food is not going to the right place when he swallows it. Pt does not appear to be acute distress. Lung sounds clear. SPO2 100%RA
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
## 2015-02-21 NOTE — Discharge Instructions (Signed)

## 2015-02-21 NOTE — ED Notes (Signed)
Pt refused Stroke swallow Screen Test----- stated, "I know I am going to throw it back up".

## 2015-02-22 ENCOUNTER — Emergency Department (HOSPITAL_BASED_OUTPATIENT_CLINIC_OR_DEPARTMENT_OTHER)
Admission: EM | Admit: 2015-02-22 | Discharge: 2015-02-22 | Disposition: A | Discharge status: Home / Self Care | Payer: No Typology Code available for payment source | Attending: Emergency Medicine | Admitting: Emergency Medicine

## 2015-02-22 ENCOUNTER — Encounter (HOSPITAL_BASED_OUTPATIENT_CLINIC_OR_DEPARTMENT_OTHER): Payer: Self-pay | Admitting: *Deleted

## 2015-02-22 DIAGNOSIS — J029 Acute pharyngitis, unspecified: Secondary | ICD-10-CM | POA: Insufficient documentation

## 2015-02-22 DIAGNOSIS — Z8719 Personal history of other diseases of the digestive system: Secondary | ICD-10-CM | POA: Insufficient documentation

## 2015-02-22 DIAGNOSIS — Z79899 Other long term (current) drug therapy: Secondary | ICD-10-CM | POA: Insufficient documentation

## 2015-02-22 HISTORY — DX: Allergy status to unspecified drugs, medicaments and biological substances: Z88.9

## 2015-02-22 MED ORDER — DEXAMETHASONE SODIUM PHOSPHATE 10 MG/ML IJ SOLN
5.0000 mg | Freq: Once | INTRAMUSCULAR | Status: AC
  Administered 2015-02-22: 5 mg via INTRAMUSCULAR
  Filled 2015-02-22: qty 1

## 2015-02-22 NOTE — Discharge Instructions (Signed)
Return to the emergency room with worsening of symptoms, new symptoms or with symptoms that are concerning , especially fevers, stiff neck, worsening headache, nausea/vomiting, visual changes or slurred speech, chest pain, shortness of breath, cough with thick colored mucous or blood °Drink plenty of fluids with electrolytes especially Gatorade. OTC cold medications such as mucinex, nyquil, dayquil are recommended. Chloraseptic for sore throat. °Read below information and follow recommendations. °Pharyngitis °Pharyngitis is redness, pain, and swelling (inflammation) of your pharynx.  °CAUSES  °Pharyngitis is usually caused by infection. Most of the time, these infections are from viruses (viral) and are part of a cold. However, sometimes pharyngitis is caused by bacteria (bacterial). Pharyngitis can also be caused by allergies. Viral pharyngitis may be spread from person to person by coughing, sneezing, and personal items or utensils (cups, forks, spoons, toothbrushes). Bacterial pharyngitis may be spread from person to person by more intimate contact, such as kissing.  °SIGNS AND SYMPTOMS  °Symptoms of pharyngitis include:   °· Sore throat.   °· Tiredness (fatigue).   °· Low-grade fever.   °· Headache. °· Joint pain and muscle aches. °· Skin rashes. °· Swollen lymph nodes. °· Plaque-like film on throat or tonsils (often seen with bacterial pharyngitis). °DIAGNOSIS  °Your health care provider will ask you questions about your illness and your symptoms. Your medical history, along with a physical exam, is often all that is needed to diagnose pharyngitis. Sometimes, a rapid strep test is done. Other lab tests may also be done, depending on the suspected cause.  °TREATMENT  °Viral pharyngitis will usually get better in 3-4 days without the use of medicine. Bacterial pharyngitis is treated with medicines that kill germs (antibiotics).  °HOME CARE INSTRUCTIONS  °· Drink enough water and fluids to keep your urine clear or  pale yellow.   °· Only take over-the-counter or prescription medicines as directed by your health care provider:   °¨ If you are prescribed antibiotics, make sure you finish them even if you start to feel better.   °¨ Do not take aspirin.   °· Get lots of rest.   °· Gargle with 8 oz of salt water (½ tsp of salt per 1 qt of water) as often as every 1-2 hours to soothe your throat.   °· Throat lozenges (if you are not at risk for choking) or sprays may be used to soothe your throat. °SEEK MEDICAL CARE IF:  °· You have large, tender lumps in your neck. °· You have a rash. °· You cough up green, yellow-brown, or bloody spit. °SEEK IMMEDIATE MEDICAL CARE IF:  °· Your neck becomes stiff. °· You drool or are unable to swallow liquids. °· You vomit or are unable to keep medicines or liquids down. °· You have severe pain that does not go away with the use of recommended medicines. °· You have trouble breathing (not caused by a stuffy nose). °MAKE SURE YOU:  °· Understand these instructions. °· Will watch your condition. °· Will get help right away if you are not doing well or get worse. °Document Released: 10/03/2005 Document Revised: 07/24/2013 Document Reviewed: 06/10/2013 °[employer]® Patient Information ©2015 [employer]. This information is not intended to replace advice given to you by your health care provider. Make sure you discuss any questions you have with your health care provider. ° °

## 2015-02-22 NOTE — ED Provider Notes (Signed)
CSN: [id number]     Arrival date & time 02/22/15  1419 History   First MD Initiated Contact with Patient 02/22/15 1449     Chief Complaint  Patient presents with  . Nasal Congestion  . Sore Throat     (Consider location/radiation/quality/duration/timing/severity/associated sxs/prior Treatment) HPI  Roberto Moody is a 29 y.o. male with PMH of GERD presenting with one week of sore throat worse with swallowing. Patient denies fevers, chills. He's been taking Mucinex without significant improvement of his symptoms. Patient also with dry cough. Patient presented this morning and was given azithromycin and Hycodan on and diagnosed with pharyngitis. Negative strep and chest x-ray. No shortness of breath, nausea, vomiting, abdominal pain, chest pain.   Past Medical History  Diagnosis Date  . Bronchitis   . GERD (gastroesophageal reflux disease)   . Multiple allergies    Past Surgical History  Procedure Laterality Date  . Hernia repair    . Tubes in ears    . Adenoidectomy     Family History  Problem Relation Age of Onset  . Cancer Mother     lung   History  Substance Use Topics  . Smoking status: Passive Smoke Exposure - Never Smoker  . Smokeless tobacco: Never Used  . Alcohol Use: No    Review of Systems See above   Allergies  Bee venom and Shellfish allergy  Home Medications   Prior to Admission medications   Medication Sig Start Date End Date Taking? Authorizing Provider  diphenhydrAMINE (BENADRYL) 25 MG tablet Take 1 tablet (25 mg total) by mouth every 6 (six) hours as needed for allergies. 03/14/14  Yes Marissa Sciacca, PA-C  famotidine (PEPCID) 10 MG tablet Take 5 mg by mouth at bedtime.   Yes Historical Provider, MD  famotidine (PEPCID) 20 MG tablet Take 1 tablet (20 mg total) by mouth 2 (two) times daily. 03/14/14  Yes Marissa Sciacca, PA-C  ibuprofen (ADVIL,MOTRIN) 800 MG tablet Take 1 tablet (800 mg total) by mouth every 8 (eight) hours as needed. 01/11/14  Yes  Amber Nydia BoutonM Hairford, MD  azithromycin (ZITHROMAX) 250 MG tablet Take 1 tablet (250 mg total) by mouth daily. Take first 2 tablets together, then 1 every day until finished. 02/21/15   Hanna Patel-Mills, PA-C  cyclobenzaprine (FLEXERIL) 10 MG tablet Take 0.5 tablets (5 mg total) by mouth 2 (two) times daily as needed for muscle spasms. Patient not taking: Reported on 02/21/2015 07/14/14   Purvis SheffieldForrest Harrison, MD  HYDROcodone-homatropine [facility](HYCODAN) 5-1.5 MG/5ML syrup Take 5 mLs by mouth every 6 (six) hours as needed for cough. 02/21/15   Hanna Patel-Mills, PA-C  oxyCODONE-acetaminophen (PERCOCET) 5-325 MG per tablet Take 1-2 tablets by mouth every 6 (six) hours as needed for moderate pain. Patient not taking: Reported on 02/21/2015 07/14/14   Purvis SheffieldForrest Harrison, MD  predniSONE (DELTASONE) 20 MG tablet 3 tabs po day one, then 2 tabs daily x 4 days Patient not taking: Reported on 02/21/2015 03/14/14   Marissa Sciacca, PA-C   BP 136/77 mmHg  Pulse 80  Temp(Src) 98.3 F (36.8 C) (Oral)  Resp 18  Ht 6' 2.5" (1.892 m)  Wt 200 lb (90.719 kg)  BMI 25.34 kg/m2  SpO2 100% Physical Exam  Constitutional: He appears well-developed and well-nourished. No distress.  HENT:  Head: Normocephalic and atraumatic.  Nose: Right sinus exhibits no maxillary sinus tenderness and no frontal sinus tenderness. Left sinus exhibits no maxillary sinus tenderness and no frontal sinus tenderness.  Mouth/Throat: Mucous membranes are normal. Posterior oropharyngeal  edema and posterior oropharyngeal erythema present. No oropharyngeal exudate.  No trismus or uvula deviation.  Eyes: Conjunctivae and EOM are normal. Right eye exhibits no discharge. Left eye exhibits no discharge.  Neck: Normal range of motion. Neck supple.  Cardiovascular: Normal rate, regular rhythm and normal heart sounds.   Pulmonary/Chest: Effort normal and breath sounds normal. No respiratory distress. He has no wheezes. He has no rales.  Abdominal: Soft. Bowel sounds are  normal. He exhibits no distension. There is no tenderness.  Lymphadenopathy:    He has cervical adenopathy.  Neurological: He is alert.  Skin: Skin is warm and dry. He is not diaphoretic.  Nursing note and vitals reviewed.   ED Course  Procedures (including critical care time) Labs Review Labs Reviewed - No data to display  Imaging Review Dg Chest 2 View (if Patient Has Fever And/or Copd)  02/21/2015   CLINICAL DATA:  Sore throat and shortness of Breath, recent choking incident  EXAM: CHEST  2 VIEW  COMPARISON:  None.  FINDINGS: The heart size and mediastinal contours are within normal limits. Both lungs are clear. The visualized skeletal structures are unremarkable.  IMPRESSION: No active cardiopulmonary disease.   Electronically Signed   By: Alcide CleverMark  Lukens M.D.   On: 02/21/2015 21:41     EKG Interpretation None      MDM   Final diagnoses:  Pharyngitis   Patient presenting with symptoms of viral pharyngitis. Vital signs stable in NAD. No trismus or uvula deviation. No exudates or tonsils. Patient with negative strep and chest x-ray performed earlier today. Patient given IM steroid in ED was significant improvement of his symptoms. Patient tolerating fluids without difficulty in the emergency room. A shunt well appearing nontoxic nonseptic and stable for discharge. Patient given referral to wellness center to Establish care and follow-up as needed.  Discussed return precautions with patient. Discussed all results and patient verbalizes understanding and agrees with plan.    Oswaldo ConroyVictoria Uyen Eichholz, PA-C 02/22/15 1554  Benjiman CoreNathan Pickering, MD 02/23/15 [phone]

## 2015-02-22 NOTE — ED Notes (Addendum)
Pt reports congestions and "trouble breathing" x 1 week- also reports he feels like he is having difficulty swallowing- eval at [facility] for same- strep negative

## 2015-02-24 LAB — CULTURE, GROUP A STREP: STREP A CULTURE: NEGATIVE

## 2015-06-11 ENCOUNTER — Emergency Department (HOSPITAL_BASED_OUTPATIENT_CLINIC_OR_DEPARTMENT_OTHER)
Admission: EM | Admit: 2015-06-11 | Discharge: 2015-06-11 | Disposition: A | Discharge status: Home / Self Care | Payer: Self-pay | Attending: Emergency Medicine | Admitting: Emergency Medicine

## 2015-06-11 ENCOUNTER — Emergency Department (HOSPITAL_BASED_OUTPATIENT_CLINIC_OR_DEPARTMENT_OTHER): Payer: No Typology Code available for payment source

## 2015-06-11 ENCOUNTER — Encounter (HOSPITAL_BASED_OUTPATIENT_CLINIC_OR_DEPARTMENT_OTHER): Payer: Self-pay

## 2015-06-11 DIAGNOSIS — J069 Acute upper respiratory infection, unspecified: Secondary | ICD-10-CM | POA: Insufficient documentation

## 2015-06-11 DIAGNOSIS — Z8719 Personal history of other diseases of the digestive system: Secondary | ICD-10-CM | POA: Insufficient documentation

## 2015-06-11 LAB — RAPID STREP SCREEN (MED CTR MEBANE ONLY): Streptococcus, Group A Screen (Direct): NEGATIVE

## 2015-06-11 MED ORDER — FLUTICASONE PROPIONATE 50 MCG/ACT NA SUSP: 1.0000 | Freq: Every day | NASAL | Status: DC

## 2015-06-11 MED ORDER — SALINE SPRAY 0.65 % NA SOLN: 1.0000 | NASAL | Status: DC | PRN

## 2015-06-11 MED ORDER — BENZONATATE 100 MG PO CAPS: 100.0000 mg | ORAL_CAPSULE | Freq: Three times a day (TID) | ORAL | Status: DC | PRN

## 2015-06-11 NOTE — Discharge Instructions (Signed)
Upper Respiratory Infection, Adult An upper respiratory infection (URI) is also sometimes known as the common cold. The upper respiratory tract includes the nose, sinuses, throat, trachea, and bronchi. Bronchi are the airways leading to the lungs. Most people improve within 1 week, but symptoms can last up to 2 weeks. A residual cough may last even longer.  CAUSES Many different viruses can infect the tissues lining the upper respiratory tract. The tissues become irritated and inflamed and often become very moist. Mucus production is also common. A cold is contagious. You can easily spread the virus to others by oral contact. This includes kissing, sharing a glass, coughing, or sneezing. Touching your mouth or nose and then touching a surface, which is then touched by another person, can also spread the virus. SYMPTOMS  Symptoms typically develop 1 to 3 days after you come in contact with a cold virus. Symptoms vary from person to person. They may include:  Runny nose.  Sneezing.  Nasal congestion.  Sinus irritation.  Sore throat.  Loss of voice (laryngitis).  Cough.  Fatigue.  Muscle aches.  Loss of appetite.  Headache.  Low-grade fever. DIAGNOSIS  You might diagnose your own cold based on familiar symptoms, since most people get a cold 2 to 3 times a year. Your caregiver can confirm this based on your exam. Most importantly, your caregiver can check that your symptoms are not due to another disease such as strep throat, sinusitis, pneumonia, asthma, or epiglottitis. Blood tests, throat tests, and X-rays are not necessary to diagnose a common cold, but they may sometimes be helpful in excluding other more serious diseases. Your caregiver will decide if any further tests are required. RISKS AND COMPLICATIONS  You may be at risk for a more severe case of the common cold if you smoke cigarettes, have chronic heart disease (such as heart failure) or lung disease (such as asthma), or if  you have a weakened immune system. The very young and very old are also at risk for more serious infections. Bacterial sinusitis, middle ear infections, and bacterial pneumonia can complicate the common cold. The common cold can worsen asthma and chronic obstructive pulmonary disease (COPD). Sometimes, these complications can require emergency medical care and may be life-threatening. PREVENTION  The best way to protect against getting a cold is to practice good hygiene. Avoid oral or hand contact with people with cold symptoms. Wash your hands often if contact occurs. There is no clear evidence that vitamin C, vitamin E, echinacea, or exercise reduces the chance of developing a cold. However, it is always recommended to get plenty of rest and practice good nutrition. TREATMENT  Treatment is directed at relieving symptoms. There is no cure. Antibiotics are not effective, because the infection is caused by a virus, not by bacteria. Treatment may include:  Increased fluid intake. Sports drinks offer valuable electrolytes, sugars, and fluids.  Breathing heated mist or steam (vaporizer or shower).  Eating chicken soup or other clear broths, and maintaining good nutrition.  Getting plenty of rest.  Using gargles or lozenges for comfort.  Controlling fevers with ibuprofen or acetaminophen as directed by your caregiver.  Increasing usage of your inhaler if you have asthma. Zinc gel and zinc lozenges, taken in the first 24 hours of the common cold, can shorten the duration and lessen the severity of symptoms. Pain medicines may help with fever, muscle aches, and throat pain. A variety of non-prescription medicines are available to treat congestion and runny nose. Your caregiver   can make recommendations and may suggest nasal or lung inhalers for other symptoms.  HOME CARE INSTRUCTIONS   Only take over-the-counter or prescription medicines for pain, discomfort, or fever as directed by your  caregiver.  Use a warm mist humidifier or inhale steam from a shower to increase air moisture. This may keep secretions moist and make it easier to breathe.  Drink enough water and fluids to keep your urine clear or pale yellow.  Rest as needed.  Return to work when your temperature has returned to normal or as your caregiver advises. You may need to stay home longer to avoid infecting others. You can also use a face mask and careful hand washing to prevent spread of the virus. SEEK MEDICAL CARE IF:   After the first few days, you feel you are getting worse rather than better.  You need your caregiver's advice about medicines to control symptoms.  You develop chills, worsening shortness of breath, or brown or red sputum. These may be signs of pneumonia.  You develop yellow or brown nasal discharge or pain in the face, especially when you bend forward. These may be signs of sinusitis.  You develop a fever, swollen neck glands, pain with swallowing, or white areas in the back of your throat. These may be signs of strep throat. SEEK IMMEDIATE MEDICAL CARE IF:   You have a fever.  You develop severe or persistent headache, ear pain, sinus pain, or chest pain.  You develop wheezing, a prolonged cough, cough up blood, or have a change in your usual mucus (if you have chronic lung disease).  You develop sore muscles or a stiff neck. Document Released: 03/29/2001 Document Revised: 12/26/2011 Document Reviewed: 01/08/2014 Exit[employer] Patient Information 2015 [employer]. This information is not intended to replace advice given to you by your health care provider. Make sure you discuss any questions you have with your health care provider.  

## 2015-06-11 NOTE — ED Notes (Signed)
Pt c/o productive cough x3 days, clear to yellow sputum, c/o sore throat

## 2015-06-11 NOTE — ED Provider Notes (Signed)
CSN: [id number]     Arrival date & time 06/11/15  0326 History   First MD Initiated Contact with Patient 06/11/15 [phone]     Chief Complaint  Patient presents with  . Cough     (Consider location/radiation/quality/duration/timing/severity/associated sxs/prior Treatment) HPI  This is a 29 year old male who presents with cough and sore throat. Onset of symptoms 3 days ago. Denies any fevers. Reports cough is productive of yellow sputum. Overnight has had worsening sore throat. Has also noted rhinorrhea. Denies any ear pain, shortness breath, or chest pain. Patient is a nonsmoker. No history of asthma. Denies any nausea, vomiting, diarrhea, abdominal pain.  Past Medical History  Diagnosis Date  . Bronchitis   . GERD (gastroesophageal reflux disease)   . Multiple allergies    Past Surgical History  Procedure Laterality Date  . Hernia repair    . Tubes in ears    . Adenoidectomy     Family History  Problem Relation Age of Onset  . Cancer Mother     lung   Social History  Substance Use Topics  . Smoking status: Passive Smoke Exposure - Never Smoker  . Smokeless tobacco: Never Used  . Alcohol Use: No    Review of Systems  Constitutional: Negative.  Negative for fever.  HENT: Positive for sore throat. Negative for ear pain.   Respiratory: Positive for cough. Negative for chest tightness and shortness of breath.   Cardiovascular: Negative.  Negative for chest pain.  Gastrointestinal: Negative.  Negative for nausea, vomiting, abdominal pain and diarrhea.  Genitourinary: Negative.  Negative for dysuria.  Skin: Negative for rash.  Neurological: Negative for headaches.  All other systems reviewed and are negative.      Allergies  Bee venom and Shellfish allergy  Home Medications   Prior to Admission medications   Medication Sig Start Date End Date Taking? Authorizing Provider  azithromycin (ZITHROMAX) 250 MG tablet Take 1 tablet (250 mg total) by mouth daily. Take first 2  tablets together, then 1 every day until finished. 02/21/15   Hanna Patel-Mills, PA-C  benzonatate (TESSALON) 100 MG capsule Take 1 capsule (100 mg total) by mouth 3 (three) times daily as needed for cough. 06/11/15   Shon Baton, MD  cyclobenzaprine (FLEXERIL) 10 MG tablet Take 0.5 tablets (5 mg total) by mouth 2 (two) times daily as needed for muscle spasms. Patient not taking: Reported on 02/21/2015 07/14/14   [name], MD  diphenhydrAMINE (BENADRYL) 25 MG tablet Take 1 tablet (25 mg total) by mouth every 6 (six) hours as needed for allergies. 03/14/14   Marissa Sciacca, PA-C  famotidine (PEPCID) 10 MG tablet Take 5 mg by mouth at bedtime.    Historical Provider, MD  famotidine (PEPCID) 20 MG tablet Take 1 tablet (20 mg total) by mouth 2 (two) times daily. 03/14/14   Marissa Sciacca, PA-C  fluticasone (FLONASE) 50 MCG/ACT nasal spray Place 1 spray into both nostrils daily. 06/11/15   Shon Baton, MD  HYDROcodone-homatropine (HYCODAN) 5-1.5 MG/5ML syrup Take 5 mLs by mouth every 6 (six) hours as needed for cough. 02/21/15   Hanna Patel-Mills, PA-C  ibuprofen (ADVIL,MOTRIN) 800 MG tablet Take 1 tablet (800 mg total) by mouth every 8 (eight) hours as needed. 01/11/14   Amber Nydia Bouton, MD  oxyCODONE-acetaminophen (PERCOCET) 5-325 MG per tablet Take 1-2 tablets by mouth every 6 (six) hours as needed for moderate pain. Patient not taking: Reported on 02/21/2015 07/14/14   [name], MD  predniSONE (DELTASONE) 20  MG tablet 3 tabs po day one, then 2 tabs daily x 4 days Patient not taking: Reported on 02/21/2015 03/14/14   Marissa Sciacca, PA-C  sodium chloride (OCEAN) 0.65 % SOLN nasal spray Place 1 spray into both nostrils as needed for congestion. 06/11/15   Shon Baton, MD   BP 145/70 mmHg  Pulse 68  Temp(Src) 99 F (37.2 C) (Oral)  Resp 20  Ht  (1.905 m)  Wt 200 lb (90.719 kg)  BMI 25.00 kg/m2  SpO2 100% Physical Exam  Constitutional: He is oriented to person, place, and  time. He appears well-developed and well-nourished. No distress.  HENT:  Head: Normocephalic and atraumatic.  Right Ear: External ear normal.  Left Ear: External ear normal.  Mild erythema to the posterior oropharynx, no evidence of tonsillar exudate, uvula midline, no tonsillar swelling noted  Eyes: Pupils are equal, round, and reactive to light.  Neck: Neck supple.  Cardiovascular: Normal rate, regular rhythm and normal heart sounds.   No murmur heard. Pulmonary/Chest: Effort normal and breath sounds normal. No respiratory distress. He has no wheezes.  Abdominal: Soft. Bowel sounds are normal. There is no tenderness. There is no rebound.  Musculoskeletal: He exhibits no edema.  Lymphadenopathy:    He has cervical adenopathy.  Neurological: He is alert and oriented to person, place, and time.  Skin: Skin is warm and dry.  Psychiatric: He has a normal mood and affect.  Nursing note and vitals reviewed.   ED Course  Procedures (including critical care time) Labs Review Labs Reviewed  RAPID STREP SCREEN (NOT AT [facility])  CULTURE, GROUP A STREP    Imaging Review Dg Chest 2 View  06/11/2015   CLINICAL DATA:  Initial evaluation for 3 day history of acute productive cough. Sore throat.  EXAM: CHEST  2 VIEW  COMPARISON:  Prior radiograph from 02/21/2015.  FINDINGS: The cardiac and mediastinal silhouettes are stable in size and contour, and remain within normal limits.  The lungs are normally inflated. No airspace consolidation, pleural effusion, or pulmonary edema is identified. There is no pneumothorax.  No acute osseous abnormality identified.  IMPRESSION: No active cardiopulmonary disease.   Electronically Signed   By: Rise Mu M.D.   On: 06/11/2015 04:04   I have personally reviewed and evaluated these images and lab results as part of my medical decision-making.   EKG Interpretation None      MDM   Final diagnoses:  URI (upper respiratory infection)   Patient  presents with cough and sore throat. Nontoxic and afebrile. No evidence of wheezing and chest sounds are clear. Chest x-ray is negative for pneumonia. Strep screen is negative for strep. Suspect viral etiology given combination of symptoms. Discussed with patient symptom control at home including Flonase for congestion and rhinorrhea and Tessalon Perles for cough. Patient is a nonsmoker and feel that a inhaler may be of limited utility given that he is not currently wheezing. Patient was given return precautions.  After history, exam, and medical workup I feel the patient has been appropriately medically screened and is safe for discharge home. Pertinent diagnoses were discussed with the patient. Patient was given return precautions.    Shon Baton, MD 06/11/15 [phone]

## 2015-06-12 ENCOUNTER — Emergency Department (HOSPITAL_COMMUNITY)
Admission: EM | Admit: 2015-06-12 | Discharge: 2015-06-12 | Disposition: A | Discharge status: Home / Self Care | Payer: Self-pay | Attending: Emergency Medicine | Admitting: Emergency Medicine

## 2015-06-12 ENCOUNTER — Encounter (HOSPITAL_COMMUNITY): Payer: Self-pay | Admitting: Emergency Medicine

## 2015-06-12 DIAGNOSIS — X58XXXA Exposure to other specified factors, initial encounter: Secondary | ICD-10-CM | POA: Insufficient documentation

## 2015-06-12 DIAGNOSIS — Z79899 Other long term (current) drug therapy: Secondary | ICD-10-CM | POA: Insufficient documentation

## 2015-06-12 DIAGNOSIS — K219 Gastro-esophageal reflux disease without esophagitis: Secondary | ICD-10-CM | POA: Insufficient documentation

## 2015-06-12 DIAGNOSIS — Y9289 Other specified places as the place of occurrence of the external cause: Secondary | ICD-10-CM | POA: Insufficient documentation

## 2015-06-12 DIAGNOSIS — Y998 Other external cause status: Secondary | ICD-10-CM | POA: Insufficient documentation

## 2015-06-12 DIAGNOSIS — Z8709 Personal history of other diseases of the respiratory system: Secondary | ICD-10-CM | POA: Insufficient documentation

## 2015-06-12 DIAGNOSIS — Y9389 Activity, other specified: Secondary | ICD-10-CM | POA: Insufficient documentation

## 2015-06-12 DIAGNOSIS — Z7951 Long term (current) use of inhaled steroids: Secondary | ICD-10-CM | POA: Insufficient documentation

## 2015-06-12 DIAGNOSIS — T7840XA Allergy, unspecified, initial encounter: Secondary | ICD-10-CM | POA: Insufficient documentation

## 2015-06-12 MED ORDER — PREDNISONE 20 MG PO TABS
60.0000 mg | ORAL_TABLET | Freq: Once | ORAL | Status: AC
  Administered 2015-06-12: 60 mg via ORAL
  Filled 2015-06-12: qty 3

## 2015-06-12 MED ORDER — DIPHENHYDRAMINE HCL 25 MG PO CAPS
25.0000 mg | ORAL_CAPSULE | Freq: Once | ORAL | Status: AC
  Administered 2015-06-12: 25 mg via ORAL
  Filled 2015-06-12: qty 1

## 2015-06-12 MED ORDER — PREDNISONE 20 MG PO TABS: 40.0000 mg | ORAL_TABLET | Freq: Every day | ORAL | Status: DC

## 2015-06-12 MED ORDER — FAMOTIDINE 20 MG PO TABS
20.0000 mg | ORAL_TABLET | Freq: Once | ORAL | Status: AC
  Administered 2015-06-12: 20 mg via ORAL
  Filled 2015-06-12: qty 1

## 2015-06-12 NOTE — ED Provider Notes (Signed)
CSN: [id number]     Arrival date & time 06/12/15  0805 History   First MD Initiated Contact with Patient 06/12/15 0813     Chief Complaint  Patient presents with  . 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 Patient was seen at [facility] yesterday for cough and upper respiratory infection symptoms. He had a negative chest x-ray and was started on Flonase and [employer]. Patient took one Tessalon Perles last night at approximately 9:30 PM. Patient states that afterwards he "felt weird" and did not sleep well. Symptoms were more pronounced this morning so he presents to the emergency department for evaluation. No treatments prior to arrival, including no Benadryl. Patient denies syncope, vomiting, or diarrhea. Patient's throat remains sore. The onset of this condition was acute. The course is constant. Aggravating factors: none. Alleviating factors: none.    Patient is a 29 y.o. male presenting with allergic reaction. The history is provided by the patient.  Allergic Reaction Presenting symptoms: rash   Presenting symptoms: no difficulty swallowing and no wheezing     Past Medical History  Diagnosis Date  . Bronchitis   . GERD (gastroesophageal reflux disease)   . Multiple allergies    Past Surgical History  Procedure Laterality Date  . Hernia repair    . Tubes in ears    . Adenoidectomy     Family History  Problem Relation Age of Onset  . Cancer Mother     lung   Social History  Substance Use Topics  . Smoking status: Passive Smoke Exposure - Never Smoker  . Smokeless tobacco: Never Used  . Alcohol Use: No    Review of Systems  Constitutional: Negative for fever.  HENT: Positive for sore throat.  Negative for facial swelling and trouble swallowing.   Eyes: Negative for redness.  Respiratory: Negative for shortness of breath, wheezing and stridor.   Cardiovascular: Negative for chest pain.  Gastrointestinal: Negative for nausea and vomiting.  Musculoskeletal: Negative for myalgias.  Skin: Positive for rash.  Neurological: Negative for light-headedness.  Psychiatric/Behavioral: Negative for confusion.    Allergies  Bee venom and Shellfish allergy  Home Medications   Prior to Admission medications   Medication Sig Start Date End Date Taking? Authorizing Provider  azithromycin (ZITHROMAX) 250 MG tablet Take 1 tablet (250 mg total) by mouth daily. Take first 2 tablets together, then 1 every day until finished. 02/21/15   Hanna Patel-Mills, PA-C  benzonatate (TESSALON) 100 MG capsule Take 1 capsule (100 mg total) by mouth 3 (three) times daily as needed for cough. 06/11/15   Shon Baton, MD  cyclobenzaprine (FLEXERIL) 10 MG tablet Take 0.5 tablets (5 mg total) by mouth 2 (two) times daily as needed for muscle spasms. Patient not taking: Reported on 02/21/2015 07/14/14   [name], MD  diphenhydrAMINE (BENADRYL) 25 MG tablet Take 1 tablet (25 mg total) by mouth every 6 (six) hours as needed for allergies. 03/14/14   Marissa Sciacca, PA-C  famotidine (PEPCID) 10 MG tablet Take 5 mg by mouth at bedtime.    Historical Provider, MD  famotidine (PEPCID) 20 MG tablet Take 1 tablet (20 mg total) by mouth 2 (two) times daily. 03/14/14   Marissa Sciacca, PA-C  fluticasone (FLONASE) 50 MCG/ACT nasal spray Place 1 spray into both  nostrils daily. 06/11/15   Shon Baton, MD  HYDROcodone-homatropine (HYCODAN) 5-1.5 MG/5ML syrup Take 5 mLs by mouth every 6 (six) hours as needed for cough. 02/21/15   Hanna Patel-Mills, PA-C  ibuprofen (ADVIL,MOTRIN) 800 MG tablet Take 1 tablet (800 mg total) by mouth every 8 (eight) hours as needed. 01/11/14   Amber Nydia Bouton, MD  oxyCODONE-acetaminophen  (PERCOCET) 5-325 MG per tablet Take 1-2 tablets by mouth every 6 (six) hours as needed for moderate pain. Patient not taking: Reported on 02/21/2015 07/14/14   [name], MD  predniSONE (DELTASONE) 20 MG tablet 3 tabs po day one, then 2 tabs daily x 4 days Patient not taking: Reported on 02/21/2015 03/14/14   Marissa Sciacca, PA-C  sodium chloride (OCEAN) 0.65 % SOLN nasal spray Place 1 spray into both nostrils as needed for congestion. 06/11/15   Shon Baton, MD   BP 143/56 mmHg  Pulse 70  Temp(Src) 98 F (36.7 C) (Oral)  Resp 16  Ht 6' 2.5" (1.892 m)  Wt 205 lb (92.987 kg)  BMI 25.98 kg/m2  SpO2 100%   Physical Exam  Constitutional: He appears well-developed and well-nourished.  HENT:  Head: Normocephalic and atraumatic.  Right Ear: Tympanic membrane, external ear and ear canal normal.  Left Ear: Tympanic membrane, external ear and ear canal normal.  Nose: Nose normal. No mucosal edema or rhinorrhea.  Mouth/Throat: Uvula is midline, oropharynx is clear and moist and mucous membranes are normal. Mucous membranes are not dry. No trismus in the jaw. No uvula swelling. No oropharyngeal exudate, posterior oropharyngeal edema, posterior oropharyngeal erythema or tonsillar abscesses.  No angioedema.   Eyes: Conjunctivae are normal. Right eye exhibits no discharge. Left eye exhibits no discharge.  Neck: Normal range of motion. Neck supple.  Cardiovascular: Normal rate, regular rhythm and normal heart sounds.   Pulmonary/Chest: Effort normal and breath sounds normal. No respiratory distress. He has no wheezes. He has no rales.  Abdominal: Soft. There is no tenderness.  Neurological: He is alert.  Skin: Skin is warm and dry.  Small inflamed area in R antecubital area, questionable hives, there is excoriation.   Psychiatric: He has a normal mood and affect.  Nursing note and vitals reviewed.   ED Course  Procedures (including critical care time) Labs Review Labs Reviewed - No  data to display  Imaging Review Dg Chest 2 View  06/11/2015   CLINICAL DATA:  Initial evaluation for 3 day history of acute productive cough. Sore throat.  EXAM: CHEST  2 VIEW  COMPARISON:  Prior radiograph from 02/21/2015.  FINDINGS: The cardiac and mediastinal silhouettes are stable in size and contour, and remain within normal limits.  The lungs are normally inflated. No airspace consolidation, pleural effusion, or pulmonary edema is identified. There is no pneumothorax.  No acute osseous abnormality identified.  IMPRESSION: No active cardiopulmonary disease.   Electronically Signed   By: Rise Mu M.D.   On: 06/11/2015 04:04   I have personally reviewed and evaluated these images and lab results as part of my medical decision-making.   EKG Interpretation None       8:34 AM Patient seen and examined. Plan: pepcid, benadryl, prednisone. Monitor for 1 hour. If stable, can go home. Pt appears well. Reaction is mild.    Vital signs reviewed and are as follows: BP 143/56 mmHg  Pulse 70  Temp(Src) 98 F (36.7 C) (Oral)  Resp 16  Ht 6' 2.5" (1.892 m)  Wt 205 lb (92.987 kg)  BMI 25.98 kg/m2  SpO2 100%  9:44 AM Symptoms controlled. Not worsening. Exam is unchanged with no angioedema developing. Patient has ambulated without assistance.   Will d/c to home.   Encouraged to return with any worsening symptoms, trouble breathing, shortness of breath, or other problems. Encouraged to call 911 with difficulty breathing.  MDM   Final diagnoses:  Allergic reaction, initial encounter   Patient with possible allergic reaction, possibly to Tessalon. No severe angioedema on exam here today. Patient monitored in the emergency department after receiving Benadryl, Pepcid, and prednisone without any deterioration in his condition. Patient appears well.    Renne Crigler, PA-C 06/12/15 0945  Alvira Monday, MD 06/15/15 1001

## 2015-06-12 NOTE — ED Notes (Signed)
Pt states that he itches and he feels that his throat is swollen , no hives noted , no redness in throat noted sats 100 % on RA, NAD noted

## 2015-06-12 NOTE — Discharge Instructions (Signed)
Please read and follow all provided instructions.  Your diagnoses today include:  1. Allergic reaction, initial encounter    Tests performed today include:  Vital signs. See below for your results today.   Medications prescribed:   Prednisone - steroid medicine   It is best to take this medication in the morning to prevent sleeping problems. If you are diabetic, monitor your blood sugar closely and stop taking Prednisone if blood sugar is over 300. Take with food to prevent stomach upset.     Benadryl (diphenhydramine) - antihistamine  You can find this medication over-the-counter.   DO NOT exceed:    Benadryl every 6 hours    Benadryl will make you drowsy. DO NOT drive or perform any activities that require you to be awake and alert if taking this.   Pepcid (famotidine) - antihistamine  You can find this medication over-the-counter.   DO NOT exceed:    Pepcid every 12 hours    Take any prescribed medications only as directed.  Home care instructions:   Follow any educational materials contained in this packet  Follow-up instructions: Please follow-up with your primary care provider in the next 3 days for further evaluation of your symptoms.   Return instructions:   Please return to the Emergency Department if you experience worsening symptoms.   Call 9-1-1 immediately if you have an allergic reaction that involves your lips, mouth, throat or if you have any difficulty breathing. This is a life-threatening emergency.   Please return if you have any other emergent concerns.  Additional Information:  Your vital signs today were: BP 143/56 mmHg   Pulse 70   Temp(Src) 98 F (36.7 C) (Oral)   Resp 16   Ht 6' 2.5" (1.892 m)   Wt 205 lb (92.987 kg)   BMI 25.98 kg/m2   SpO2 100% If your blood pressure (BP) was elevated above 135/85 this visit, please have this repeated by your doctor within one month. --------------

## 2015-06-12 NOTE — ED Notes (Signed)
Pt here from home with c/o allergic reaction . Pt took one Tessalon  perles last night , states  That he had some itching and feels like his throat is slightly swollen

## 2015-06-14 LAB — CULTURE, GROUP A STREP: Strep A Culture: NEGATIVE

## 2015-08-21 ENCOUNTER — Emergency Department (HOSPITAL_BASED_OUTPATIENT_CLINIC_OR_DEPARTMENT_OTHER)
Admission: EM | Admit: 2015-08-21 | Discharge: 2015-08-21 | Disposition: A | Discharge status: Home / Self Care | Payer: Self-pay | Attending: Emergency Medicine | Admitting: Emergency Medicine

## 2015-08-21 ENCOUNTER — Encounter (HOSPITAL_BASED_OUTPATIENT_CLINIC_OR_DEPARTMENT_OTHER): Payer: Self-pay | Admitting: *Deleted

## 2015-08-21 ENCOUNTER — Emergency Department (HOSPITAL_BASED_OUTPATIENT_CLINIC_OR_DEPARTMENT_OTHER): Payer: Self-pay

## 2015-08-21 DIAGNOSIS — Z8719 Personal history of other diseases of the digestive system: Secondary | ICD-10-CM | POA: Insufficient documentation

## 2015-08-21 DIAGNOSIS — Z7951 Long term (current) use of inhaled steroids: Secondary | ICD-10-CM | POA: Insufficient documentation

## 2015-08-21 DIAGNOSIS — J069 Acute upper respiratory infection, unspecified: Secondary | ICD-10-CM | POA: Insufficient documentation

## 2015-08-21 DIAGNOSIS — Z7952 Long term (current) use of systemic steroids: Secondary | ICD-10-CM | POA: Insufficient documentation

## 2015-08-21 LAB — RAPID STREP SCREEN (MED CTR MEBANE ONLY): Streptococcus, Group A Screen (Direct): NEGATIVE

## 2015-08-21 NOTE — ED Notes (Signed)
Patient came out and said he had to leave to get to work on time.  Reviewed with Dr. Dalene SeltzerSchlossman, ok to let pt go.

## 2015-08-21 NOTE — ED Notes (Signed)
Patient transported to X-ray 

## 2015-08-21 NOTE — ED Provider Notes (Signed)
CSN: [id number]     Arrival date & time 08/21/15  1324 History   First MD Initiated Contact with Patient 08/21/15 1056     Chief Complaint  Patient presents with  . Sore Throat     (Consider location/radiation/quality/duration/timing/severity/associated sxs/prior Treatment) HPI Comments: Cough for weeks Feeling wheezing, sore throat, congestion over last 4 days flonase didn't help  Patient is a 29 y.o. male presenting with pharyngitis.  Sore Throat This is a new problem. Associated symptoms include headaches. Pertinent negatives include no chest pain, no abdominal pain and no shortness of breath. Nothing aggravates the symptoms. Nothing relieves the symptoms. He has tried nothing for the symptoms. The treatment provided no relief.    Past Medical History  Diagnosis Date  . Bronchitis   . GERD (gastroesophageal reflux disease)   . Multiple allergies    Past Surgical History  Procedure Laterality Date  . Hernia repair    . Tubes in ears    . Adenoidectomy     Family History  Problem Relation Age of Onset  . Cancer Mother     lung   Social History  Substance Use Topics  . Smoking status: Passive Smoke Exposure - Never Smoker  . Smokeless tobacco: Never Used  . Alcohol Use: No    Review of Systems  Constitutional: Positive for fatigue. Negative for fever and appetite change.  HENT: Negative for sore throat.   Eyes: Negative for visual disturbance.  Respiratory: Positive for cough (clear sputum) and wheezing. Negative for shortness of breath and stridor.   Cardiovascular: Negative for chest pain.  Gastrointestinal: Negative for nausea, vomiting, abdominal pain, diarrhea and constipation.  Genitourinary: Negative for difficulty urinating.  Musculoskeletal: Negative for back pain and neck stiffness.  Skin: Negative for rash.  Neurological: Positive for headaches. Negative for syncope.      Allergies  Bee venom and Shellfish allergy  Home Medications   Prior to  Admission medications   Medication Sig Start Date End Date Taking? Authorizing Provider  diphenhydrAMINE (BENADRYL) 25 MG tablet Take 1 tablet (25 mg total) by mouth every 6 (six) hours as needed for allergies. 03/14/14  Yes Marissa Sciacca, PA-C  famotidine (PEPCID) 20 MG tablet Take 1 tablet (20 mg total) by mouth 2 (two) times daily. 03/14/14  Yes Marissa Sciacca, PA-C  azithromycin (ZITHROMAX) 250 MG tablet Take 1 tablet (250 mg total) by mouth daily. Take first 2 tablets together, then 1 every day until finished. 02/21/15   Hanna Patel-Mills, PA-C  benzonatate (TESSALON) 100 MG capsule Take 1 capsule (100 mg total) by mouth 3 (three) times daily as needed for cough. 06/11/15   Shon Baton, MD  famotidine (PEPCID) 10 MG tablet Take 5 mg by mouth at bedtime.    Historical Provider, MD  fluticasone (FLONASE) 50 MCG/ACT nasal spray Place 1 spray into both nostrils daily. 06/11/15   Shon Baton, MD  HYDROcodone-homatropine (HYCODAN) 5-1.5 MG/5ML syrup Take 5 mLs by mouth every 6 (six) hours as needed for cough. 02/21/15   Hanna Patel-Mills, PA-C  ibuprofen (ADVIL,MOTRIN) 800 MG tablet Take 1 tablet (800 mg total) by mouth every 8 (eight) hours as needed. 01/11/14   Amber Nydia Bouton, MD  predniSONE (DELTASONE) 20 MG tablet Take 2 tablets (40 mg total) by mouth daily. 06/12/15   Renne Crigler, PA-C  sodium chloride (OCEAN) 0.65 % SOLN nasal spray Place 1 spray into both nostrils as needed for congestion. 06/11/15   Shon Baton, MD   BP 132/46  mmHg  Pulse 68  Temp(Src) 98.3 F (36.8 C) (Oral)  Resp 18  Ht 6\' 2"  (1.88 m)  Wt 205 lb (92.987 kg)  BMI 26.31 kg/m2  SpO2 98% Physical Exam  Constitutional: He is oriented to person, place, and time. He appears well-developed and well-nourished. No distress.  HENT:  Head: Normocephalic and atraumatic.  Mouth/Throat: Oropharynx is clear and moist. No oropharyngeal exudate.  Eyes: Conjunctivae and EOM are normal. Pupils are equal, round, and  reactive to light.  Neck: Normal range of motion.  Cardiovascular: Normal rate, regular rhythm, normal heart sounds and intact distal pulses.  Exam reveals no gallop and no friction rub.   No murmur heard. Pulmonary/Chest: Effort normal and breath sounds normal. No respiratory distress. He has no wheezes. He has no rales.  Abdominal: Soft. He exhibits no distension. There is no tenderness. There is no guarding.  Musculoskeletal: He exhibits no edema.  Neurological: He is alert and oriented to person, place, and time.  Skin: Skin is warm and dry. He is not diaphoretic.  Nursing note and vitals reviewed.   ED Course  Procedures (including critical care time) Labs Review Labs Reviewed  RAPID STREP SCREEN (NOT AT [facility])  CULTURE, GROUP A STREP    Imaging Review No results found. I have personally reviewed and evaluated these images and lab results as part of my medical decision-making.   EKG Interpretation None      MDM   Final diagnoses:  None   29yo male with hx of GERD, allergies, presents with concern for cough, sore throat, congestion.  No sign of RPA, PTA, epiglottitis. Strep screen negative. XR without pneumonia. Hx, physical exam consistent with likely viral URI.  Recommend supportive treatment and follow up with PCP. Patient discharged in stable condition with understanding of reasons to return.     Alvira MondayErin Sahir Tolson, MD 08/22/15 1020

## 2015-08-21 NOTE — ED Notes (Signed)
Patient states he has a four day history of headache, sore throat, sinus congestion and productive cough with tan secretions.

## 2015-08-21 NOTE — Discharge Instructions (Signed)
°Emergency Department Resource Guide °1) Find a Doctor and Pay Out of Pocket °Although you won't have to find out who is covered by your insurance plan, it is a good idea to ask around and get recommendations. You will then need to call the office and see if the doctor you have chosen will accept you as a new patient and what types of options they offer for patients who are self-pay. Some doctors offer discounts or will set up payment plans for their patients who do not have insurance, but you will need to ask so you aren't surprised when you get to your appointment. ° °2) Contact Your Local Health Department °Not all health departments have doctors that can see patients for sick visits, but many do, so it is worth a call to see if yours does. If you don't know where your local health department is, you can check in your phone book. The CDC also has a tool to help you locate your state's health department, and many state websites also have listings of all of their local health departments. ° °3) Find a Walk-in Clinic °If your illness is not likely to be very severe or complicated, you may want to try a walk in clinic. These are popping up all over the country in pharmacies, drugstores, and shopping centers. They're usually staffed by nurse practitioners or physician assistants that have been trained to treat common illnesses and complaints. They're usually fairly quick and inexpensive. However, if you have serious medical issues or chronic medical problems, these are probably not your best option. ° °No Primary Care Doctor: °- Call Health Connect at  832-8000 - they can help you locate a primary care doctor that  accepts your insurance, provides certain services, etc. °- Physician Referral Service- 1-800-533-3463 ° °Chronic Pain Problems: °Organization         Address  Phone   Notes  °[facility]  (336) 297-2271 Patients need to be referred by their primary care doctor.  ° °Medication  Assistance: °Organization         Address  Phone   Notes  °[place] [facility] [address] °[place], [place] [address] (336) 641-8030 --Must be a resident of [place] °-- Must have NO insurance coverage whatsoever (no Medicaid/ Medicare, etc.) °-- The pt. MUST have a primary care doctor that directs their care regularly and follows them in the community °  °[facility]  (866) 331-1348   °[employer]  (888) 892-1162   ° °Agencies that provide inexpensive medical care: °Organization         Address  Phone   Notes  °[facility]  (336) 832-8035   °[facility]    (336) 832-7272   °[facility] [address] °[place], [place] [address] (336) 832-4777   °[facility] [address], °[place] (336) 271-4999   °[facility]    (336) 373-0678   °[facility]    (336) 272-1050   °[facility] ° [address], [place] Phone:  (336) 832-4444, Fax:  (336) 832-4440 Hours of Operation:  9 am - 6 pm, M-F.  Also accepts Medicaid/Medicare and self-pay.  °[facility] ° [address], [place] Phone: (336) [phone], Fax: (336) [phone]. Hours of Operation:  8:30 am - 5:30 pm, M-F.  Also accepts Medicaid and self-pay.  °[facility] [place] [address], [place] Phone: (336) 878-6027   °[facility] [address], [place], [place] (336)723-1848, Ext. 123 Mondays & Thursdays: 7-9 AM.  First 15 patients are seen on a first come, first serve basis. °  ° °Medicaid-accepting [place] Providers: ° °Organization         Address  Phone   Notes  °[facility] [address], [place] (336) 641-2100 Also accepts self-pay patients.  °[facility] [address], [place] ° (336) 856-9996   °[facility] [address], [place]  (336) 288-8857   °[facility] [address] [place] [address], [place] (336) 299-7000   °Veita Bland [address], [place]  ° (336) 373-1557 Only accepts [place] Access Medicaid patients after they have their name applied to their card.  ° °Self-Pay (no insurance) in [place]: ° °Organization         Address  Phone   Notes  °Sickle Cell Patients, [facility] [address], [place] (336) 832-1970   °[facility] [address], [place] (336) 832-4400   °[facility] ° [address], [place] (336) 992-4800   °[facility]/Dr. Osei-Bonsu ° [address] [place] [address], [place] or [address], [place] (336) [phone] Phone number for both [place] and [place] locations is the same.  °[facility] [address], [place] (336) 299-0000   °[facility] [address] [place] [address], [place] or [address] (336) 852-7530 °(336) 878-2260   °[facility] [address], [place] (336) 350-1642, phone; (336) 294-5005, fax Sees patients 1st and 3rd Saturday of every month.  Must not qualify for public or private insurance (i.e. Medicaid, Medicare, [place] Health Choice, Veterans' Benefits) • Household income should be no more than 200% of the poverty level •The clinic cannot treat you if you are pregnant or think you are pregnant • Sexually transmitted diseases are not treated at the clinic.  ° ° °Dental Care: °Organization         Address  Phone  Notes  °[place] [facility] [address], [place] (336) 641-6152 Accepts children up to age 21 who are enrolled in Medicaid or [place] Health Choice; pregnant women with a Medicaid card; and children who have applied for Medicaid or [place] Health Choice, but were declined, whose parents can pay a reduced fee at time of service.  °[place]  [facility] [place]  [address], [place] (336) 641-7733 Accepts children up to age 21 who are enrolled in Medicaid or [place] Health Choice; pregnant women with a Medicaid card; and children who have applied for Medicaid or [place] Health Choice, but were declined, whose parents can pay a reduced fee at time of service.  °[facility] ° [address], [place] (336) 641-4533 Patients are seen by appointment only. Walk-ins are not accepted. [facility] will see patients 18 years of age and older. °Monday - Tuesday (8am-5pm) °Most Wednesdays (8:30-5pm) °$30 per visit, cash only  °[facility] ° [address], [place] (336) 641-4533 Patients are seen by appointment only. Walk-ins are not accepted. [facility] will see patients 18 years of age and older. °One   Wednesday Evening (Monthly: Volunteer Based).  $30 per visit, cash only  °[facility]  (919) 537-3737 for adults; Children under age 4, call Graduate Pediatric Dentistry at ([phone]) [phone]. Children aged 4-14, please call ([phone]) [phone] to request a pediatric application. ° Dental services are provided in all areas of dental care including fillings, crowns and bridges, complete and partial dentures, implants, gum treatment, root canals, and extractions. Preventive care is also provided. Treatment is provided to both adults and children. °Patients are selected via a lottery and there is often a waiting list. °  °[facility] [address], °[place] ° (336) 763-8833 [email or web address] °  °[facility] [address], [place], [place] (336)723-1848, Ext. 123 Second and Fourth Thursday of each month, opens at 6:30 AM; Clinic ends at 9 AM.  Patients are seen on a first-come first-served basis, and a limited number are seen during each clinic.  ° °[facility] ° [address], [place], [place] (336) [phone]    Eligibility Requirements °You must have lived in [place], [place], or [place] counties for at least the last three months. °  You cannot be eligible for state or federal sponsored healthcare insurance, including Veterans Administration, Medicaid, or Medicare. °  You generally cannot be eligible for healthcare insurance through your employer.  °  How to apply: °Eligibility screenings are held every Tuesday and Wednesday afternoon from 1:00 pm until 4:00 pm. You do not need an appointment for the interview!  °[facility] [address] [place] [address], [place], [place] 336-631-2330   °[facility]  336-342-8273   °[facility]  336-703-3100   °[facility]  336-570-6415   ° °Behavioral Health Resources in the Community: °Intensive Outpatient Programs °Organization         Address  Phone  Notes  °[facility] [address], [place], [place] 336-878-6098   °[facility] [address], [place], [place] 336-832-9800   °[facility] [address], [place], [place] ° 336-882-2125   °[facility] [address],  °[place], [place] 1-800-853-5163 or 336-641-4981   °Substance Abuse Resources °Organization         Address  Phone  Notes  °[facility]  336-882-2125   °[facility]  336-784-9470   °[facility]  336-285-9073   °[facility]  336-845-3988   °Residential & Outpatient Substance Abuse Program  1-800-659-3381   °Psychological Services °Organization         Address  Phone  Notes  °[facility]  336- 832-9600   °[facility]  336- 378-7881   °[facility] [address], [place] 1-800-853-5163 or 336-641-4981   ° °Mobile Crisis Teams °Organization         Address  Phone  Notes  °[facility], Mobile Crisis Care Unit  1-877-626-1772   °Assertive °[facility] ° [address]  [place], [place] 336-834-9664   °Sharon DeEsch [address] °[place] 336-554-5454   ° °Self-Help/Support Groups °Organization         Address  Phone             Notes  °[facility]  - variety of support groups  336- 373-1402 Call for more information  °Narcotics Anonymous (NA), [facility] [address], °[place]  2 meetings at this location  ° °  Residential Treatment Programs Organization         Address  Phone  Notes  [facility] [address],    [place]  1-610-960-4540   [facility]  [address] [place] [address], [place], [place] [phone]   [facility] [address] [place], [place] 086-578-4696 Admissions: 8am-3pm M-F  [facility] [address]    [place], [place] 295-284-1324   [facility] [address] [place], [place], [place] 401-027-2536   [facility] [address]  [place], [place] 644-034-7425   [facility] Intensive Outpatient [address], [place], [place] 956-387-5643   [facility] [address] [place]  [place], [place] 3-295-188-4166 or 272-423-4118   [facility] [address] [place], [place] 323-557-3220 Accepts Medicaid  [facility] [place] [address],  [place] 2-542-706-2376 Substance Abuse/Addiction Treatment   [facility]         Address  Phone  Notes  [facility]  (773) 763-8088   Angie Fava, PhD [address] [place], [place]   (305) 493-0797 or 931-701-6354   [facility]   [address] [place], [place] (715)671-6188   [facility] [address] [place], [place] (931)336-1598 Insurance/Medicaid/sponsorship through [facility] and Families [address]                                    [place], [place] [phone] Therapy/tele-psych/case    [facility] [address], [place] 713-194-4989    Dr. Lolly Mustache  502-623-6008   [facility]  [employer] [facility] Dept. 1) [address], [place] 2) [address] [place] 3)  [address], [place] [phone] [phone]  [phone]   [facility] Child Abuse Hotline 805 730 7260 or 724-747-5661 (After Hours)       Upper Respiratory Infection, Adult Most upper respiratory infections (URIs) are a viral infection of the air passages leading to the lungs. A URI affects the nose, throat, and upper air passages. The most common type of URI is nasopharyngitis and is typically referred to as "the common cold." URIs run their course and usually go away on their own. Most of the time, a URI does not require medical attention, but sometimes a bacterial infection in the upper airways can follow a viral infection. This is called a secondary infection. Sinus and middle ear infections are common types of secondary upper respiratory infections. Bacterial pneumonia can also complicate a URI. A URI can worsen asthma and chronic obstructive pulmonary disease (COPD). Sometimes, these complications can require emergency medical care and may be life threatening.  CAUSES Almost all URIs are caused by viruses. A virus is a type of germ and can spread from one person to another.  RISKS FACTORS You may be at risk for a URI if:   You smoke.   You have chronic heart or lung disease.  You have a weakened defense (immune) system.   You are very young or very old.   You have nasal allergies or asthma.  You work in crowded or poorly ventilated areas.  You work in health care facilities or schools. SIGNS AND SYMPTOMS  Symptoms  typically develop 2-3 days after you come in contact with a cold virus. Most viral URIs last 7-10 days. However, viral URIs from the influenza virus (flu virus) can last 14-18 days and are typically more  severe. Symptoms may include:   Runny or stuffy (congested) nose.   Sneezing.   Cough.   Sore throat.   Headache.   Fatigue.   Fever.   Loss of appetite.   Pain in your forehead, behind your eyes, and over your cheekbones (sinus pain).  Muscle aches.  DIAGNOSIS  Your health care provider may diagnose a URI by:  Physical exam.  Tests to check that your symptoms are not due to another condition such as:  Strep throat.  Sinusitis.  Pneumonia.  Asthma. TREATMENT  A URI goes away on its own with time. It cannot be cured with medicines, but medicines may be prescribed or recommended to relieve symptoms. Medicines may help:  Reduce your fever.  Reduce your cough.  Relieve nasal congestion. HOME CARE INSTRUCTIONS   Take medicines only as directed by your health care provider.   Gargle warm saltwater or take cough drops to comfort your throat as directed by your health care provider.  Use a warm mist humidifier or inhale steam from a shower to increase air moisture. This may make it easier to breathe.  Drink enough fluid to keep your urine clear or pale yellow.   Eat soups and other clear broths and maintain good nutrition.   Rest as needed.   Return to work when your temperature has returned to normal or as your health care provider advises. You may need to stay home longer to avoid infecting others. You can also use a face mask and careful hand washing to prevent spread of the virus.  Increase the usage of your inhaler if you have asthma.   Do not use any tobacco products, including cigarettes, chewing tobacco, or electronic cigarettes. If you need help quitting, ask your health care provider. PREVENTION  The best way to protect yourself from getting a cold is to practice good hygiene.   Avoid oral or hand contact with people with cold symptoms.   Wash your hands often if contact occurs.  There is no clear evidence that vitamin C, vitamin  E, echinacea, or exercise reduces the chance of developing a cold. However, it is always recommended to get plenty of rest, exercise, and practice good nutrition.  SEEK MEDICAL CARE IF:   You are getting worse rather than better.   Your symptoms are not controlled by medicine.   You have chills.  You have worsening shortness of breath.  You have brown or red mucus.  You have yellow or brown nasal discharge.  You have pain in your face, especially when you bend forward.  You have a fever.  You have swollen neck glands.  You have pain while swallowing.  You have white areas in the back of your throat. SEEK IMMEDIATE MEDICAL CARE IF:   You have severe or persistent:  Headache.  Ear pain.  Sinus pain.  Chest pain.  You have chronic lung disease and any of the following:  Wheezing.  Prolonged cough.  Coughing up blood.  A change in your usual mucus.  You have a stiff neck.  You have changes in your:  Vision.  Hearing.  Thinking.  Mood. MAKE SURE YOU:   Understand these instructions.  Will watch your condition.  Will get help right away if you are not doing  well or get worse.   This information is not intended to replace advice given to you by your health care provider. Make sure you discuss any questions you have with your health care provider.   Document Released: 03/29/2001 Document Revised: 02/17/2015 Document Reviewed: 01/08/2014 Elsevier Interactive Patient Education [employer]

## 2015-08-23 LAB — CULTURE, GROUP A STREP: STREP A CULTURE: NEGATIVE

## 2015-12-01 ENCOUNTER — Encounter (HOSPITAL_BASED_OUTPATIENT_CLINIC_OR_DEPARTMENT_OTHER): Payer: Self-pay | Admitting: *Deleted

## 2015-12-01 ENCOUNTER — Emergency Department (HOSPITAL_BASED_OUTPATIENT_CLINIC_OR_DEPARTMENT_OTHER)
Admission: EM | Admit: 2015-12-01 | Discharge: 2015-12-01 | Disposition: A | Discharge status: Home / Self Care | Payer: Self-pay | Attending: Emergency Medicine | Admitting: Emergency Medicine

## 2015-12-01 DIAGNOSIS — J029 Acute pharyngitis, unspecified: Secondary | ICD-10-CM | POA: Insufficient documentation

## 2015-12-01 DIAGNOSIS — Z7952 Long term (current) use of systemic steroids: Secondary | ICD-10-CM | POA: Insufficient documentation

## 2015-12-01 DIAGNOSIS — Z8719 Personal history of other diseases of the digestive system: Secondary | ICD-10-CM | POA: Insufficient documentation

## 2015-12-01 DIAGNOSIS — B9789 Other viral agents as the cause of diseases classified elsewhere: Secondary | ICD-10-CM

## 2015-12-01 DIAGNOSIS — Z7951 Long term (current) use of inhaled steroids: Secondary | ICD-10-CM | POA: Insufficient documentation

## 2015-12-01 DIAGNOSIS — J028 Acute pharyngitis due to other specified organisms: Secondary | ICD-10-CM

## 2015-12-01 DIAGNOSIS — Z79899 Other long term (current) drug therapy: Secondary | ICD-10-CM | POA: Insufficient documentation

## 2015-12-01 LAB — RAPID STREP SCREEN (MED CTR MEBANE ONLY): Streptococcus, Group A Screen (Direct): NEGATIVE

## 2015-12-01 NOTE — ED Notes (Signed)
Sore throat x 3 days

## 2015-12-01 NOTE — Discharge Instructions (Signed)
Please follow-up with your doctor for reevaluation as needed. He may continue taking Motrin for your pain and inflammation. You may try taking Cepacol, available at any pharmacy, for your sore throat. Return to ED for worsening symptoms as discussed.  Pharyngitis Pharyngitis is redness, pain, and swelling (inflammation) of your pharynx.  CAUSES  Pharyngitis is usually caused by infection. Most of the time, these infections are from viruses (viral) and are part of a cold. However, sometimes pharyngitis is caused by bacteria (bacterial). Pharyngitis can also be caused by allergies. Viral pharyngitis may be spread from person to person by coughing, sneezing, and personal items or utensils (cups, forks, spoons, toothbrushes). Bacterial pharyngitis may be spread from person to person by more intimate contact, such as kissing.  SIGNS AND SYMPTOMS  Symptoms of pharyngitis include:   Sore throat.   Tiredness (fatigue).   Low-grade fever.   Headache.  Joint pain and muscle aches.  Skin rashes.  Swollen lymph nodes.  Plaque-like film on throat or tonsils (often seen with bacterial pharyngitis). DIAGNOSIS  Your health care provider will ask you questions about your illness and your symptoms. Your medical history, along with a physical exam, is often all that is needed to diagnose pharyngitis. Sometimes, a rapid strep test is done. Other lab tests may also be done, depending on the suspected cause.  TREATMENT  Viral pharyngitis will usually get better in 3-4 days without the use of medicine. Bacterial pharyngitis is treated with medicines that kill germs (antibiotics).  HOME CARE INSTRUCTIONS   Drink enough water and fluids to keep your urine clear or pale yellow.   Only take over-the-counter or prescription medicines as directed by your health care provider:   If you are prescribed antibiotics, make sure you finish them even if you start to feel better.   Do not take aspirin.   Get  lots of rest.   Gargle with 8 oz of salt water ( tsp of salt per 1 qt of water) as often as every 1-2 hours to soothe your throat.   Throat lozenges (if you are not at risk for choking) or sprays may be used to soothe your throat. SEEK MEDICAL CARE IF:   You have large, tender lumps in your neck.  You have a rash.  You cough up green, yellow-brown, or bloody spit. SEEK IMMEDIATE MEDICAL CARE IF:   Your neck becomes stiff.  You drool or are unable to swallow liquids.  You vomit or are unable to keep medicines or liquids down.  You have severe pain that does not go away with the use of recommended medicines.  You have trouble breathing (not caused by a stuffy nose). MAKE SURE YOU:   Understand these instructions.  Will watch your condition.  Will get help right away if you are not doing well or get worse.   This information is not intended to replace advice given to you by your health care provider. Make sure you discuss any questions you have with your health care provider.   Document Released: 10/03/2005 Document Revised: 07/24/2013 Document Reviewed: 06/10/2013 Elsevier Interactive Patient Education [employer]

## 2015-12-01 NOTE — ED Provider Notes (Signed)
CSN: [id number]     Arrival date & time 12/01/15  1306 History   First MD Initiated Contact with Patient 12/01/15 1512     Chief Complaint  Patient presents with  . Sore Throat     (Consider location/radiation/quality/duration/timing/severity/associated sxs/prior Treatment) HPI Roberto Moody is a 30 y.o. male who comes in for evaluation of sore throat over the past 3 days. He is taking Benadryl without relief of his symptoms. Reports mild associated cough. Denies any fevers, chills, difficulties opening his jaw. He does report eating and swallowing exacerbates his symptoms. No other relieving or aggravating factors. No other medical complaints.  Past Medical History  Diagnosis Date  . Bronchitis   . GERD (gastroesophageal reflux disease)   . Multiple allergies    Past Surgical History  Procedure Laterality Date  . Hernia repair    . Tubes in ears    . Adenoidectomy     Family History  Problem Relation Age of Onset  . Cancer Mother     lung   Social History  Substance Use Topics  . Smoking status: Passive Smoke Exposure - Never Smoker  . Smokeless tobacco: Never Used  . Alcohol Use: No    Review of Systems A 10 point review of systems was completed and was negative except for pertinent positives and negatives as mentioned in the history of present illness      Allergies  Bee venom and Shellfish allergy  Home Medications   Prior to Admission medications   Medication Sig Start Date End Date Taking? Authorizing Provider  azithromycin (ZITHROMAX) 250 MG tablet Take 1 tablet (250 mg total) by mouth daily. Take first 2 tablets together, then 1 every day until finished. 02/21/15   Hanna Patel-Mills, PA-C  benzonatate (TESSALON) 100 MG capsule Take 1 capsule (100 mg total) by mouth 3 (three) times daily as needed for cough. 06/11/15   Shon Baton, MD  diphenhydrAMINE (BENADRYL) 25 MG tablet Take 1 tablet (25 mg total) by mouth every 6 (six) hours as needed for  allergies. 03/14/14   Marissa Sciacca, PA-C  famotidine (PEPCID) 10 MG tablet Take 5 mg by mouth at bedtime.    Historical Provider, MD  famotidine (PEPCID) 20 MG tablet Take 1 tablet (20 mg total) by mouth 2 (two) times daily. 03/14/14   Marissa Sciacca, PA-C  fluticasone (FLONASE) 50 MCG/ACT nasal spray Place 1 spray into both nostrils daily. 06/11/15   Shon Baton, MD  HYDROcodone-homatropine (HYCODAN) 5-1.5 MG/5ML syrup Take 5 mLs by mouth every 6 (six) hours as needed for cough. 02/21/15   Hanna Patel-Mills, PA-C  ibuprofen (ADVIL,MOTRIN) 800 MG tablet Take 1 tablet (800 mg total) by mouth every 8 (eight) hours as needed. 01/11/14   Amber Nydia Bouton, MD  predniSONE (DELTASONE) 20 MG tablet Take 2 tablets (40 mg total) by mouth daily. 06/12/15   Renne Crigler, PA-C  sodium chloride (OCEAN) 0.65 % SOLN nasal spray Place 1 spray into both nostrils as needed for congestion. 06/11/15   Shon Baton, MD   BP 131/67 mmHg  Pulse 61  Temp(Src) 98.9 F (37.2 C) (Oral)  Resp 16  Ht  (1.88 m)  Wt 92.987 kg  BMI 26.31 kg/m2  SpO2 98% Physical Exam  Constitutional: He appears well-developed and well-nourished. No distress.  Awake, alert, nontoxic appearance.  HENT:  Head: Atraumatic.  Mouth/Throat: Oropharynx is clear and moist. No oropharyngeal exudate.  Oropharynx is clear and moist. No trismus  Eyes: Conjunctivae and EOM  are normal. Right eye exhibits no discharge. Left eye exhibits no discharge. No scleral icterus.  Neck: Normal range of motion. Neck supple.  Mild cervical adenopathy  Cardiovascular: Normal rate, regular rhythm and normal heart sounds.   Pulmonary/Chest: Effort normal. He exhibits no tenderness.  Abdominal: Soft. There is no tenderness. There is no rebound.  Musculoskeletal: Normal range of motion. He exhibits no tenderness.  Baseline ROM, no obvious new focal weakness.  Neurological:  Mental status and motor strength appears baseline for patient and situation.   Skin: No rash noted. He is not diaphoretic.  Psychiatric: He has a normal mood and affect.  Nursing note and vitals reviewed.   ED Course  Procedures (including critical care time) Labs Review Labs Reviewed  RAPID STREP SCREEN (NOT AT [facility])  CULTURE, GROUP A STREP [facility])    Imaging Review No results found. I have personally reviewed and evaluated these images and lab results as part of my medical decision-making.   EKG Interpretation None     Meds given in ED:  Medications - No data to display  Discharge Medication List as of 12/01/2015  4:02 PM     Filed Vitals:   12/01/15 1313 12/01/15 1603  BP: 137/78 131/67  Pulse: 68 61  Temp: 98.9 F (37.2 C)   TempSrc: Oral   Resp: 18 16  Height:  (1.88 m)   Weight: 92.987 kg   SpO2: 100% 98%    MDM  Pt afebrile without tonsillar exudate, negative strep. Presents with mild cervical lymphadenopathy, & dysphagia; diagnosis of viral pharyngitis. No abx indicated. DC w symptomatic tx for pain  Pt does not appear dehydrated, but did discuss importance of water rehydration. Presentation non concerning for PTA or infxn spread to soft tissue. No trismus or uvula deviation. Specific return precautions discussed. Pt able to drink water in ED without difficulty with intact air way. Recommended PCP follow up. Final diagnoses:  Sore throat (viral)        Joycie Peek, PA-C 12/01/15 1623  Nelva Nay, MD 12/09/15 [phone]

## 2015-12-04 LAB — CULTURE, GROUP A STREP (THRC)

## 2016-06-07 IMAGING — DX DG CHEST 2V
2 series · 2 of 2 positions shown · non-contrast
Comparison: Prior radiograph from 02/21/2015.

CLINICAL DATA: Initial evaluation for 3 day history of acute
productive cough. Sore throat.

EXAM:
CHEST  2 VIEW

[chest pa]
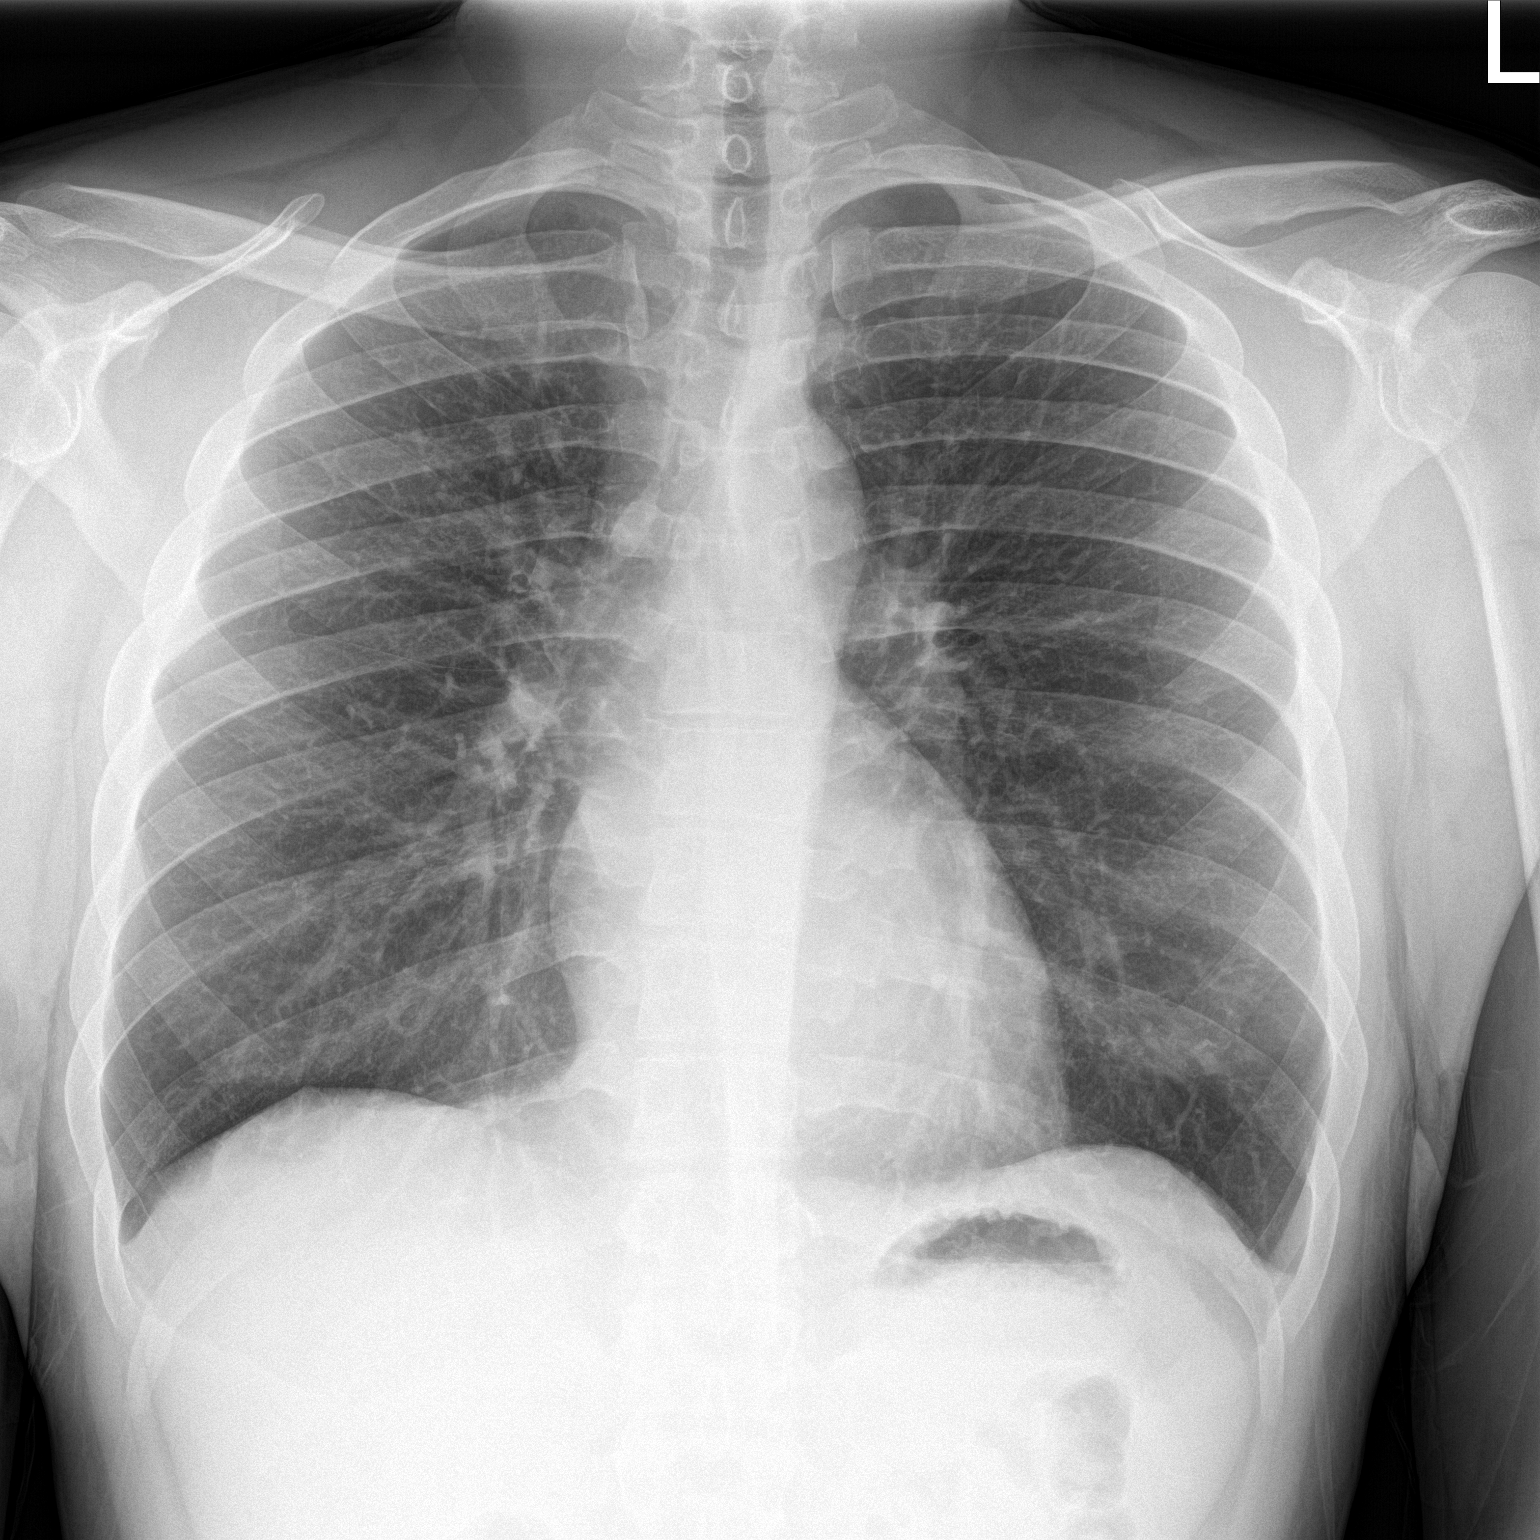

[chest lat]
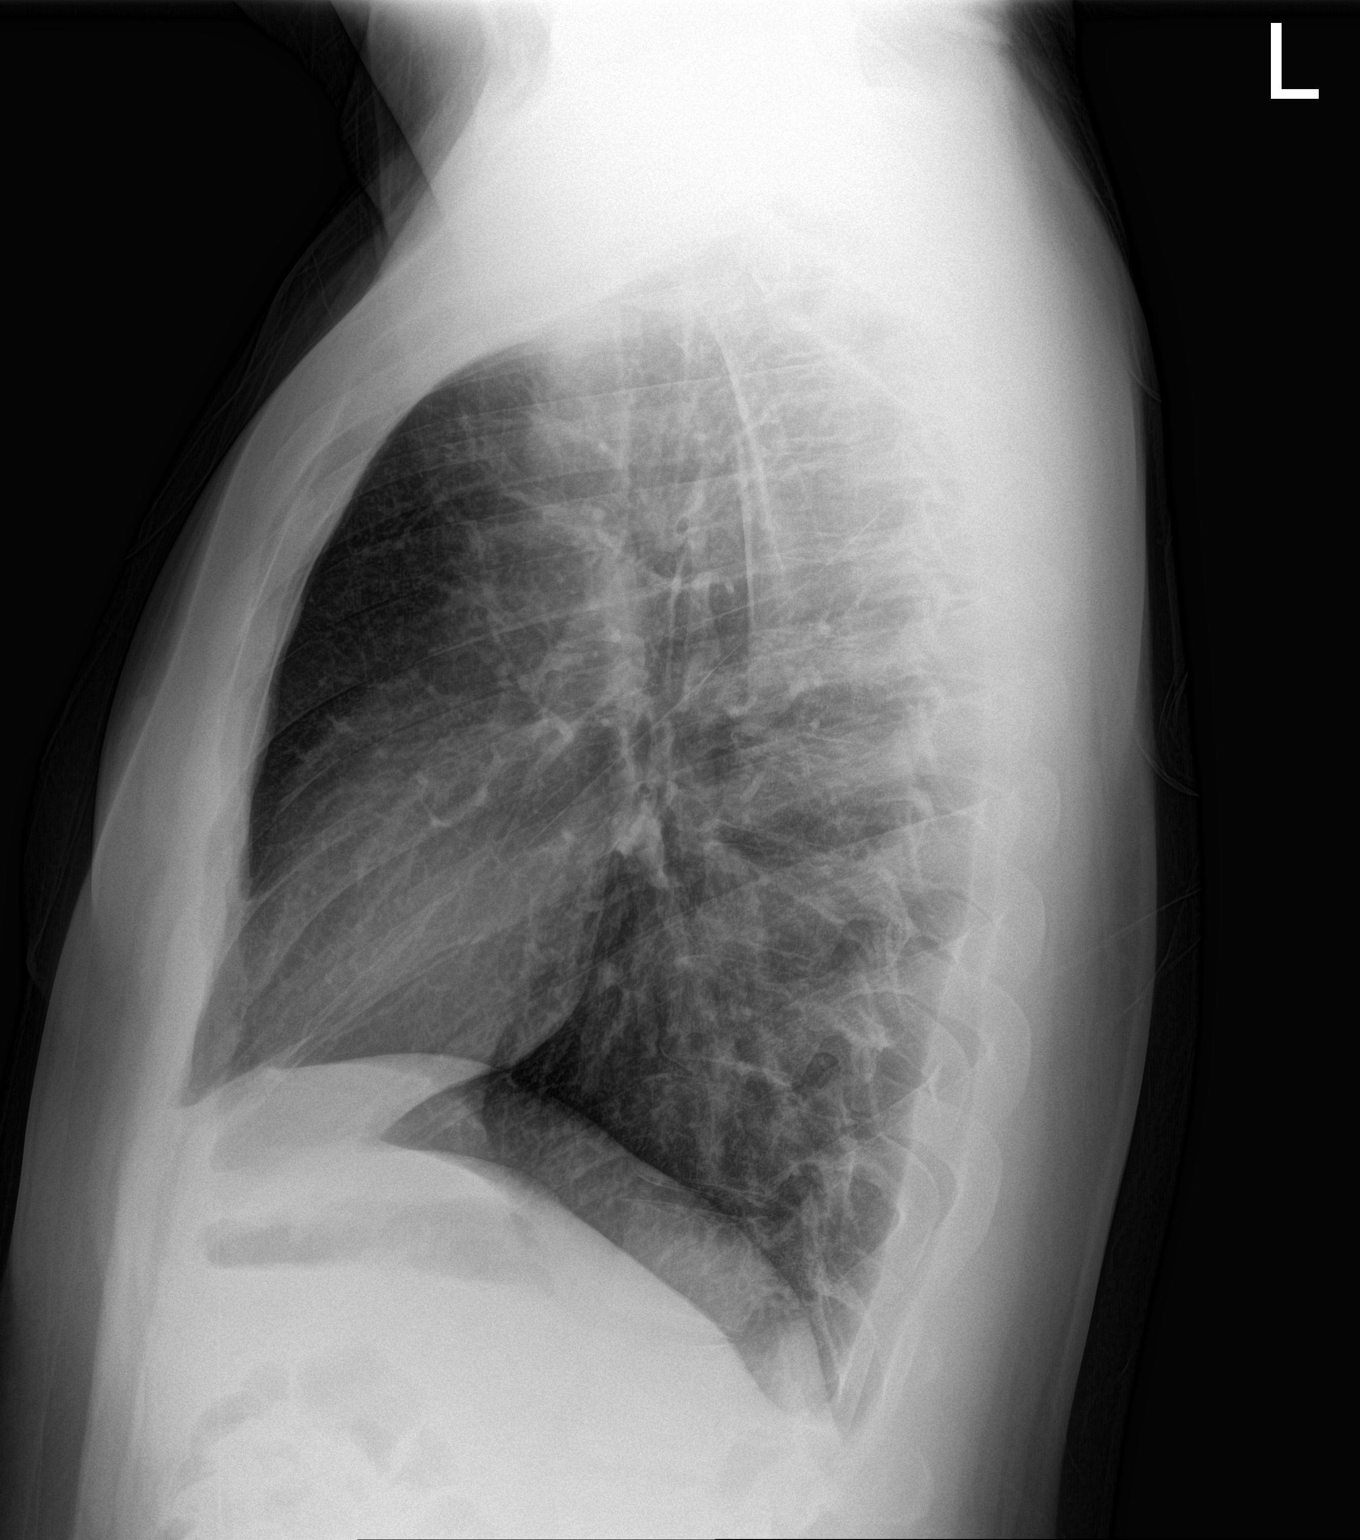

[2 of 2 positions shown; findings below may reference images not displayed]

FINDINGS: The cardiac and mediastinal silhouettes are stable in size and
contour, and remain within normal limits.

The lungs are normally inflated. No airspace consolidation, pleural
effusion, or pulmonary edema is identified. There is no
pneumothorax.

No acute osseous abnormality identified.
IMPRESSION: No active cardiopulmonary disease.

## 2016-08-17 IMAGING — DX DG CHEST 2V
2 series · 2 of 2 positions shown · non-contrast
Comparison: June 11, 2015

CLINICAL DATA: Fever and cough for 3 weeks

EXAM:
CHEST  2 VIEW

[chest pa]
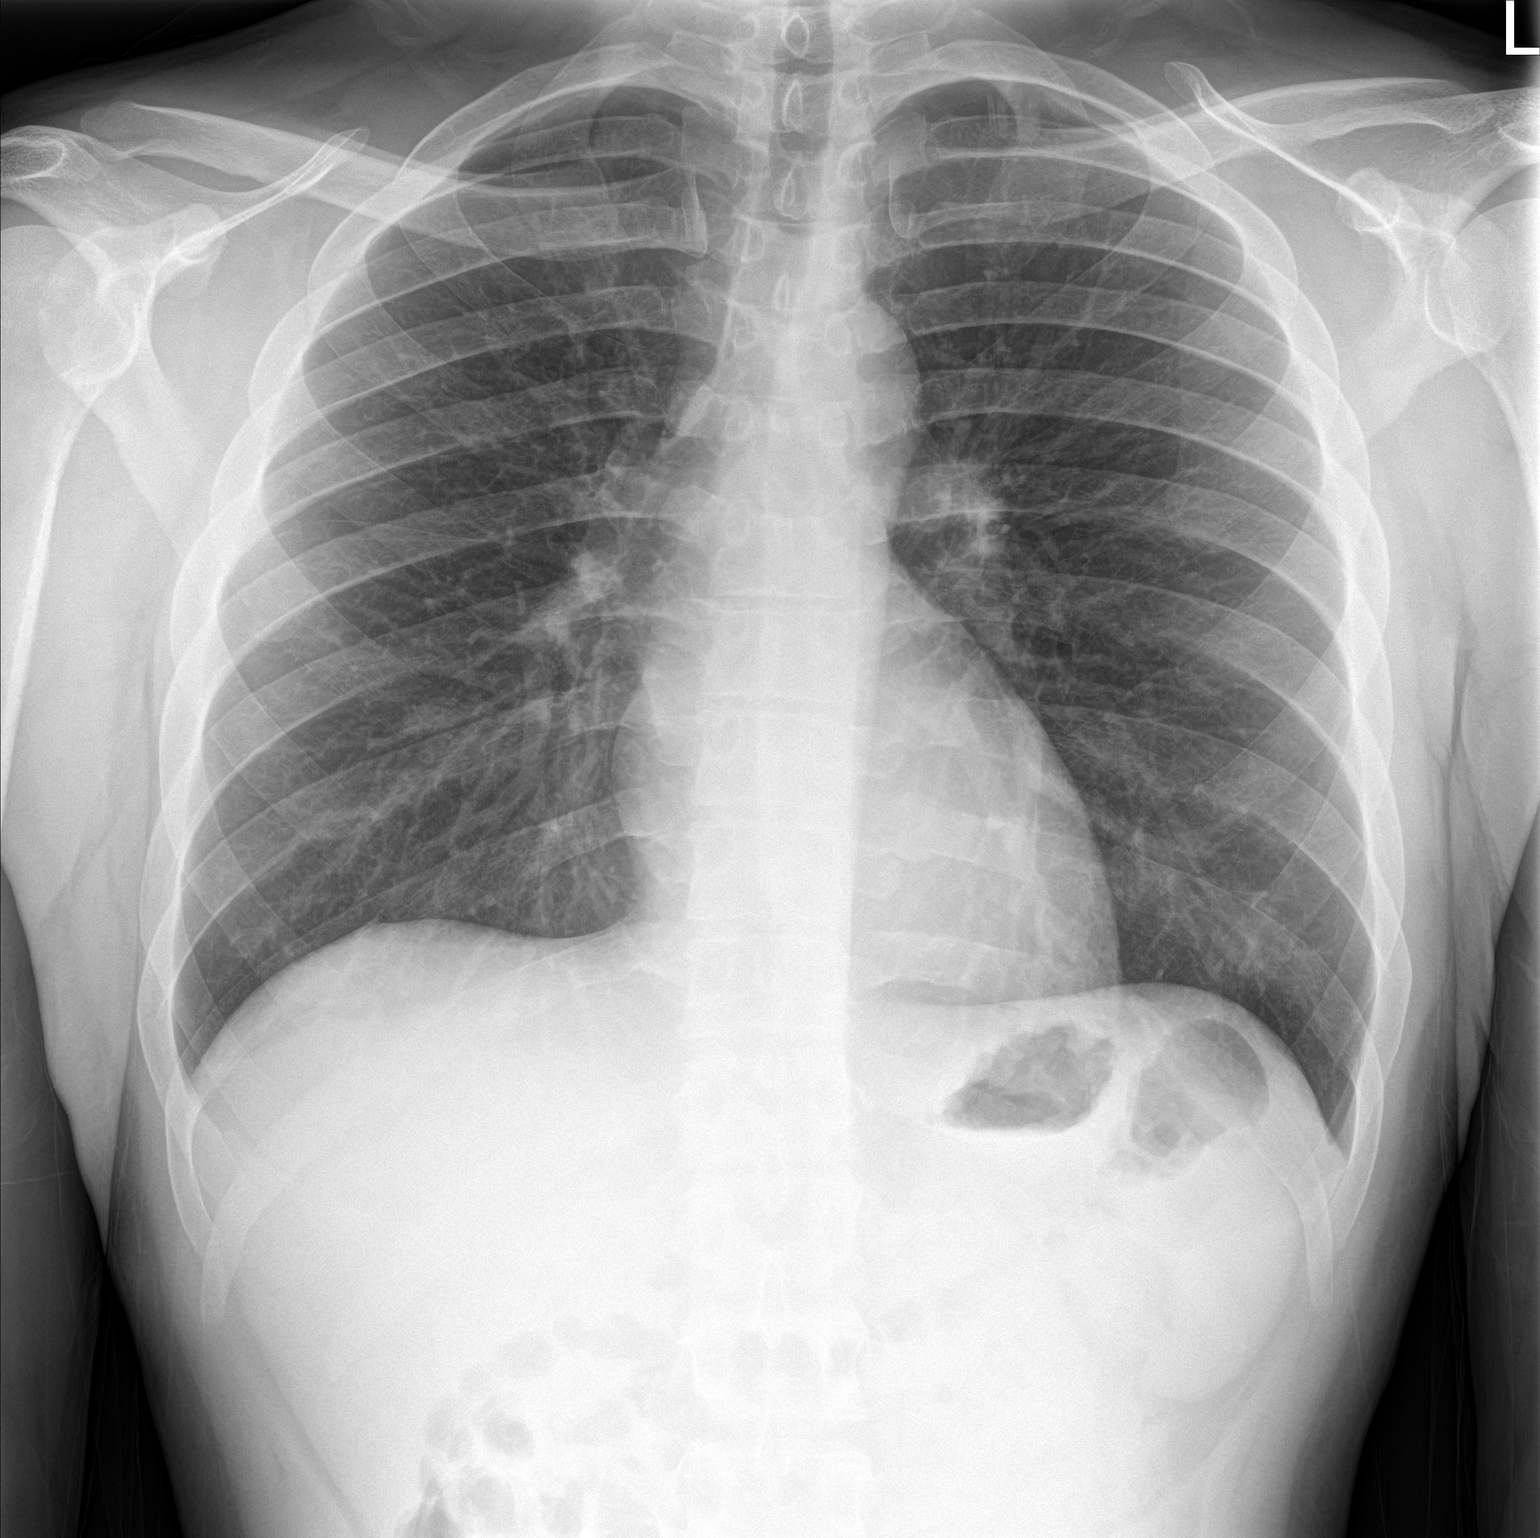

[chest lat]
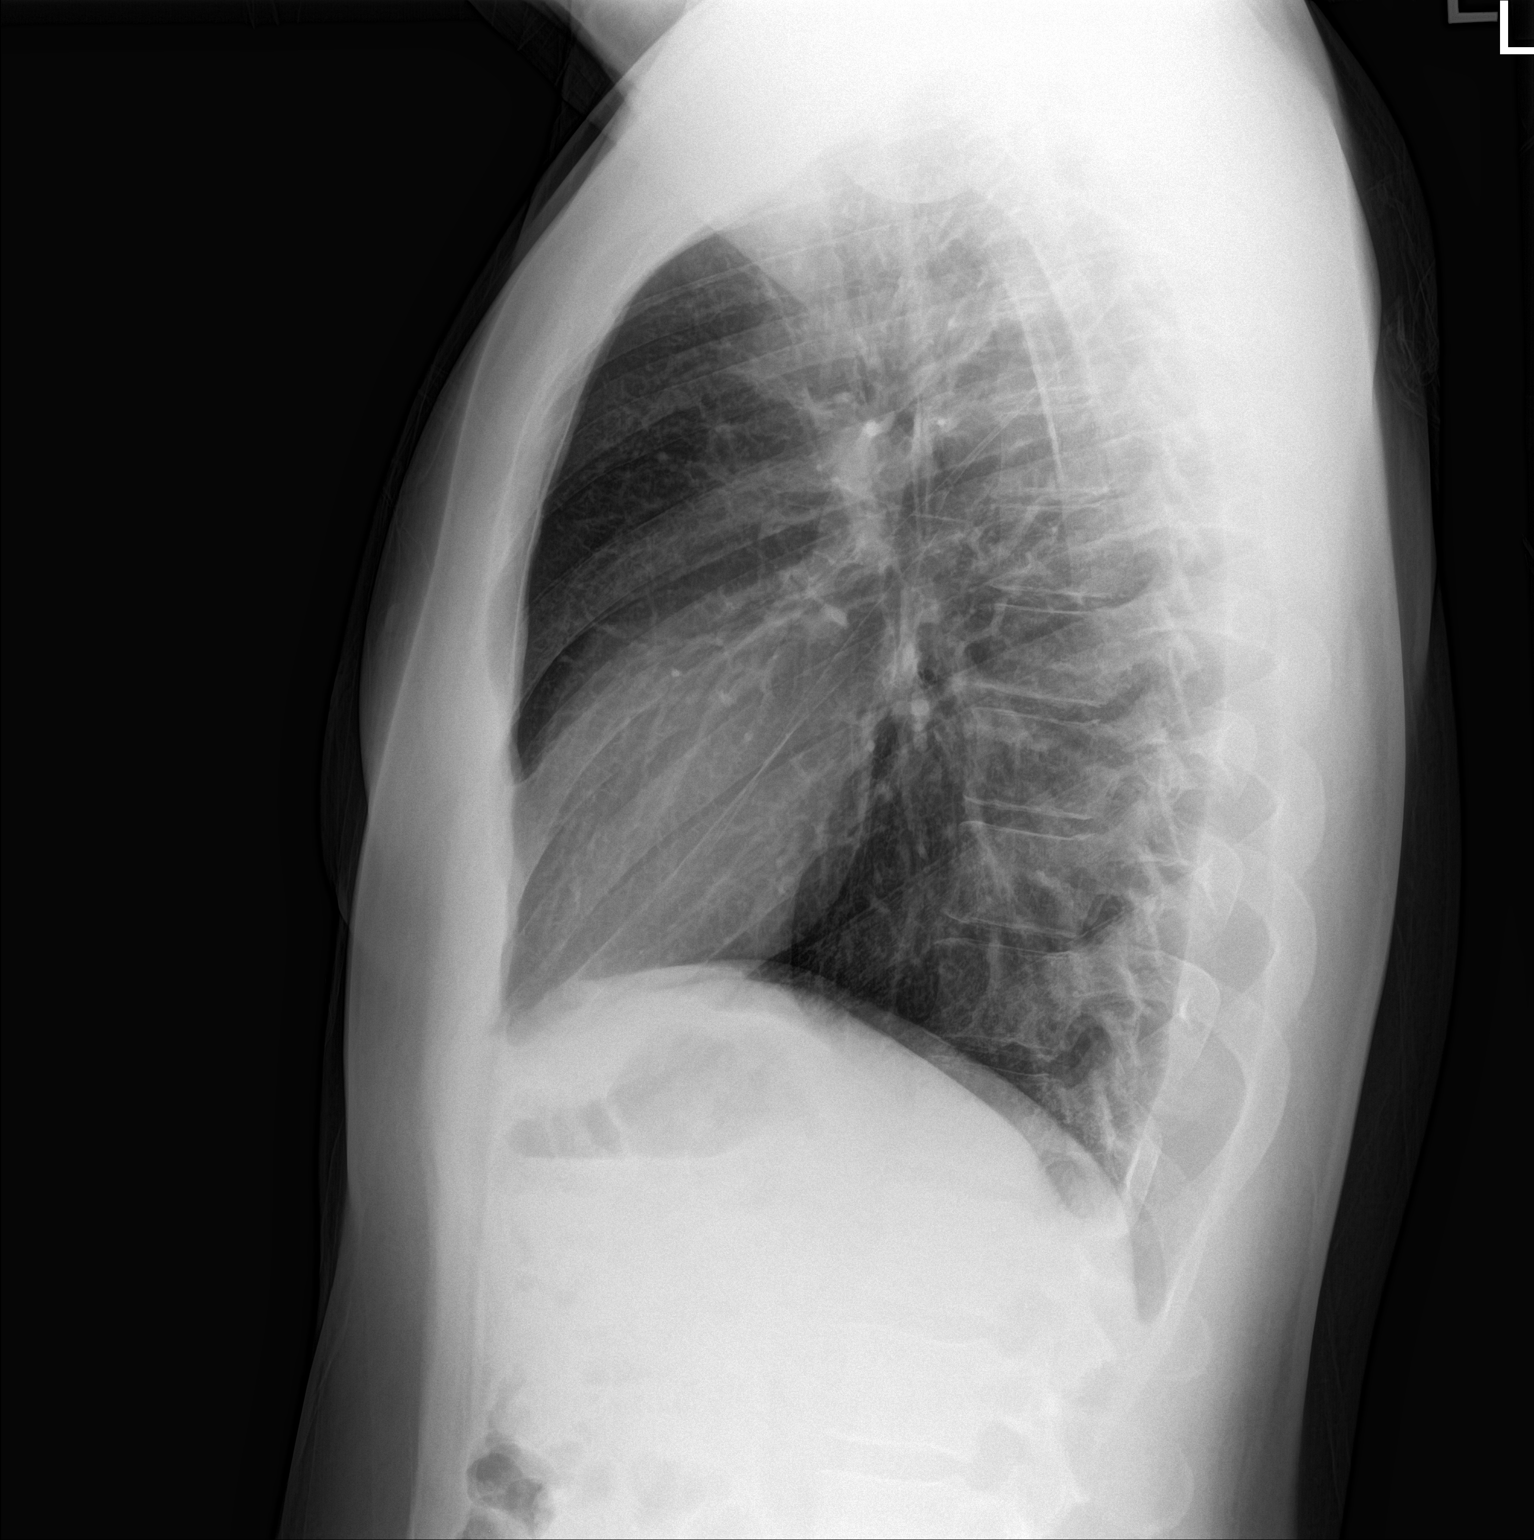

[2 of 2 positions shown; findings below may reference images not displayed]

FINDINGS: Lungs are clear. The heart size and pulmonary vascularity are
normal. No adenopathy. No bone lesions.
IMPRESSION: No edema or consolidation.

## 2019-05-31 ENCOUNTER — Encounter (HOSPITAL_COMMUNITY): Payer: Self-pay

## 2019-05-31 ENCOUNTER — Other Ambulatory Visit: Payer: Self-pay

## 2019-05-31 ENCOUNTER — Emergency Department (HOSPITAL_COMMUNITY)
Admission: EM | Admit: 2019-05-31 | Discharge: 2019-05-31 | Disposition: A | Discharge status: Home / Self Care | Payer: Self-pay | Attending: Emergency Medicine | Admitting: Emergency Medicine

## 2019-05-31 DIAGNOSIS — R070 Pain in throat: Secondary | ICD-10-CM | POA: Insufficient documentation

## 2019-05-31 DIAGNOSIS — T7840XA Allergy, unspecified, initial encounter: Secondary | ICD-10-CM | POA: Insufficient documentation

## 2019-05-31 DIAGNOSIS — Z7722 Contact with and (suspected) exposure to environmental tobacco smoke (acute) (chronic): Secondary | ICD-10-CM | POA: Insufficient documentation

## 2019-05-31 MED ORDER — LORATADINE 10 MG PO TABS
10.0000 mg | ORAL_TABLET | Freq: Once | ORAL | Status: AC
  Administered 2019-05-31: 10 mg via ORAL
  Filled 2019-05-31: qty 1

## 2019-05-31 MED ORDER — PREDNISONE 20 MG PO TABS: 40.0000 mg | ORAL_TABLET | Freq: Every day | ORAL | 0 refills | Status: DC

## 2019-05-31 MED ORDER — METHYLPREDNISOLONE SODIUM SUCC 125 MG IJ SOLR
125.0000 mg | Freq: Once | INTRAMUSCULAR | Status: AC
Start: 2019-05-31 — End: 2019-05-31
  Administered 2019-05-31: 21:00:00 125 mg via INTRAMUSCULAR
  Filled 2019-05-31: qty 2

## 2019-05-31 MED ORDER — DIPHENHYDRAMINE HCL 25 MG PO CAPS
25.0000 mg | ORAL_CAPSULE | Freq: Once | ORAL | Status: AC
  Administered 2019-05-31: 25 mg via ORAL
  Filled 2019-05-31: qty 1

## 2019-05-31 NOTE — ED Triage Notes (Signed)
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 No airway compromise at this time, but he states that he feels a little SOB. A&Ox4, but lethargic.

## 2019-05-31 NOTE — ED Provider Notes (Signed)
[facility]-EMERGENCY DEPT Provider Note   CSN: [id number] Arrival date & time: 05/31/19  1918    History   Chief Complaint Chief Complaint  Patient presents with  . Allergic Reaction    HPI Roberto Moody is a 33 y.o. male.     HPI Patient states he has a history of allergies and takes Benadryl nightly.  States he woke up from a nap at roughly 5 PM this afternoon with lower lip swelling and a scratchy throat.  Denies shortness of breath.  Denies any rashes.  Patient not take any medications prior to arrival in the emergency department.  Denies any new exposures. Past Medical History:  Diagnosis Date  . Bronchitis   . GERD (gastroesophageal reflux disease)   . Multiple allergies     Patient Active Problem List   Diagnosis Date Noted  . Sinus pain 09/29/2013  . URI (upper respiratory infection) 09/29/2013    Past Surgical History:  Procedure Laterality Date  . ADENOIDECTOMY    . HERNIA REPAIR    . tubes in ears          Home Medications    Prior to Admission medications   Medication Sig Start Date End Date Taking? Authorizing Provider  diphenhydrAMINE (BENADRYL) 25 MG tablet Take 1 tablet (25 mg total) by mouth every 6 (six) hours as needed for allergies. Patient taking differently: Take 12.5-25 mg by mouth every 6 (six) hours as needed for allergies.  03/14/14  Yes Sciacca, Marissa, PA-C  famotidine (PEPCID) 10 MG tablet Take 10 mg by mouth daily as needed for heartburn or indigestion.    Yes [provider]  azithromycin (ZITHROMAX) 250 MG tablet Take 1 tablet (250 mg total) by mouth daily. Take first 2 tablets together, then 1 every day until finished. Patient not taking: Reported on 05/31/2019 02/21/15   Patel-Mills, Lorelle FormosaHanna, PA-C  benzonatate (TESSALON) 100 MG capsule Take 1 capsule (100 mg total) by mouth 3 (three) times daily as needed for cough. Patient not taking: Reported on 05/31/2019 06/11/15   Horton, Mayer Maskerourtney F, MD  famotidine  (PEPCID) 20 MG tablet Take 1 tablet (20 mg total) by mouth 2 (two) times daily. Patient not taking: Reported on 05/31/2019 03/14/14   Sciacca, Marissa, PA-C  fluticasone (FLONASE) 50 MCG/ACT nasal spray Place 1 spray into both nostrils daily. Patient not taking: Reported on 05/31/2019 06/11/15   Horton, Mayer Maskerourtney F, MD  HYDROcodone-homatropine [facility]HYCODAN) 5-1.5 MG/5ML syrup Take 5 mLs by mouth every 6 (six) hours as needed for cough. Patient not taking: Reported on 05/31/2019 02/21/15   Patel-Mills, Lorelle FormosaHanna, PA-C  ibuprofen (ADVIL,MOTRIN) 800 MG tablet Take 1 tablet (800 mg total) by mouth every 8 (eight) hours as needed. Patient not taking: Reported on 05/31/2019 01/11/14   Hairford, Ricki MillerAmber M, MD  predniSONE (DELTASONE) 20 MG tablet Take 2 tablets (40 mg total) by mouth daily. 06/01/19   Loren RacerYelverton, Kaydin Labo, MD  sodium chloride (OCEAN) 0.65 % SOLN nasal spray Place 1 spray into both nostrils as needed for congestion. Patient not taking: Reported on 05/31/2019 06/11/15   Horton, Mayer Maskerourtney F, MD    Family History Family History  Problem Relation Age of Onset  . Cancer Mother        lung    Social History Social History   Tobacco Use  . Smoking status: Passive Smoke Exposure - Never Smoker  . Smokeless tobacco: Never Used  Substance Use Topics  . Alcohol use: No  . Drug use: No  Allergies   Bee venom and Shellfish allergy   Review of Systems Review of Systems  Constitutional: Negative for chills and fever.  HENT: Positive for facial swelling. Negative for sore throat, trouble swallowing and voice change.   Eyes: Negative for visual disturbance.  Respiratory: Negative for cough, shortness of breath, wheezing and stridor.   Cardiovascular: Negative for chest pain.  Gastrointestinal: Negative for abdominal pain, diarrhea, nausea and vomiting.  Genitourinary: Negative for dysuria, flank pain and frequency.  Musculoskeletal: Negative for back pain, myalgias and neck pain.  Skin: Negative for rash  and wound.  Neurological: Negative for dizziness, weakness, light-headedness, numbness and headaches.  All other systems reviewed and are negative.    Physical Exam Updated Vital Signs BP (!) 166/71 (BP Location: Left Arm)   Pulse (!) 102   Temp 98 F (36.7 C) (Oral)   Resp 20   SpO2 100%   Physical Exam Vitals signs and nursing note reviewed.  Constitutional:      General: He is not in acute distress.    Appearance: Normal appearance. He is well-developed. He is not ill-appearing.  HENT:     Head: Normocephalic and atraumatic.     Comments: Mild lower lip swelling bilaterally.    Nose: Nose normal.     Mouth/Throat:     Mouth: Mucous membranes are moist.     Pharynx: No oropharyngeal exudate or posterior oropharyngeal erythema.  Eyes:     Extraocular Movements: Extraocular movements intact.     Pupils: Pupils are equal, round, and reactive to light.  Neck:     Musculoskeletal: Normal range of motion and neck supple. No neck rigidity or muscular tenderness.  Cardiovascular:     Rate and Rhythm: Normal rate and regular rhythm.     Heart sounds: No murmur. No friction rub. No gallop.   Pulmonary:     Effort: Pulmonary effort is normal. No respiratory distress.     Breath sounds: Normal breath sounds. No stridor. No wheezing, rhonchi or rales.  Chest:     Chest wall: No tenderness.  Abdominal:     General: Bowel sounds are normal.     Palpations: Abdomen is soft.     Tenderness: There is no abdominal tenderness. There is no guarding or rebound.  Musculoskeletal: Normal range of motion.        General: No swelling, tenderness, deformity or signs of injury.     Right lower leg: No edema.     Left lower leg: No edema.  Lymphadenopathy:     Cervical: No cervical adenopathy.  Skin:    General: Skin is warm and dry.     Capillary Refill: Capillary refill takes less than 2 seconds.     Findings: No erythema or rash.  Neurological:     General: No focal deficit present.      Mental Status: He is alert and oriented to person, place, and time.  Psychiatric:        Behavior: Behavior normal.      ED Treatments / Results  Labs (all labs ordered are listed, but only abnormal results are displayed) Labs Reviewed - No data to display  EKG None  Radiology No results found.  Procedures Procedures (including critical care time)  Medications Ordered in ED Medications  diphenhydrAMINE (BENADRYL) capsule 25 mg (25 mg Oral Given 05/31/19 2053)  methylPREDNISolone sodium succinate (SOLU-MEDROL) 125 mg/2 mL injection 125 mg (125 mg Intramuscular Given 05/31/19 2053)  loratadine (CLARITIN) tablet 10 mg (10 mg  Oral Given 05/31/19 2053)     Initial Impression / Assessment and Plan / ED Course  I have reviewed the triage vital signs and the nursing notes.  Pertinent labs & imaging results that were available during my care of the patient were reviewed by me and considered in my medical decision making (see chart for details).        Patient is well-appearing.  Possible mild lower lip swelling.  Airway is intact.   Lip swelling has significantly improved.  Patient denies any current symptoms.  Will give short course of prednisone and advised Benadryl as needed at home.  Return precautions given. Final Clinical Impressions(s) / ED Diagnoses   Final diagnoses:  Allergic reaction, initial encounter    ED Discharge Orders         Ordered    predniSONE (DELTASONE) 20 MG tablet  Daily     05/31/19 2226           Julianne Rice, MD 05/31/19 2228

## 2019-07-19 ENCOUNTER — Other Ambulatory Visit: Payer: Self-pay

## 2019-07-19 DIAGNOSIS — Z20822 Contact with and (suspected) exposure to covid-19: Secondary | ICD-10-CM

## 2019-07-20 LAB — NOVEL CORONAVIRUS, NAA: SARS-CoV-2, NAA: NOT DETECTED

## 2019-07-24 ENCOUNTER — Telehealth: Payer: Self-pay | Admitting: General Practice

## 2019-07-24 NOTE — Telephone Encounter (Signed)
Negative COVID results given. Patient results "NOT Detected." Caller expressed understanding. ° °

## 2019-07-25 ENCOUNTER — Other Ambulatory Visit: Payer: Self-pay

## 2019-07-25 DIAGNOSIS — Z20822 Contact with and (suspected) exposure to covid-19: Secondary | ICD-10-CM

## 2019-07-26 LAB — NOVEL CORONAVIRUS, NAA: SARS-CoV-2, NAA: NOT DETECTED

## 2019-12-22 ENCOUNTER — Emergency Department (HOSPITAL_COMMUNITY)
Admission: EM | Admit: 2019-12-22 | Discharge: 2019-12-22 | Disposition: A | Discharge status: Home / Self Care | Payer: Self-pay | Attending: Emergency Medicine | Admitting: Emergency Medicine

## 2019-12-22 ENCOUNTER — Encounter (HOSPITAL_COMMUNITY): Payer: Self-pay

## 2019-12-22 ENCOUNTER — Other Ambulatory Visit: Payer: Self-pay

## 2019-12-22 DIAGNOSIS — S61411A Laceration without foreign body of right hand, initial encounter: Secondary | ICD-10-CM | POA: Insufficient documentation

## 2019-12-22 DIAGNOSIS — S61511A Laceration without foreign body of right wrist, initial encounter: Secondary | ICD-10-CM | POA: Insufficient documentation

## 2019-12-22 DIAGNOSIS — Z79899 Other long term (current) drug therapy: Secondary | ICD-10-CM | POA: Insufficient documentation

## 2019-12-22 DIAGNOSIS — Y999 Unspecified external cause status: Secondary | ICD-10-CM | POA: Insufficient documentation

## 2019-12-22 DIAGNOSIS — Y92009 Unspecified place in unspecified non-institutional (private) residence as the place of occurrence of the external cause: Secondary | ICD-10-CM | POA: Insufficient documentation

## 2019-12-22 DIAGNOSIS — Z23 Encounter for immunization: Secondary | ICD-10-CM | POA: Insufficient documentation

## 2019-12-22 DIAGNOSIS — W25XXXA Contact with sharp glass, initial encounter: Secondary | ICD-10-CM | POA: Insufficient documentation

## 2019-12-22 DIAGNOSIS — Y9389 Activity, other specified: Secondary | ICD-10-CM | POA: Insufficient documentation

## 2019-12-22 DIAGNOSIS — Z7722 Contact with and (suspected) exposure to environmental tobacco smoke (acute) (chronic): Secondary | ICD-10-CM | POA: Insufficient documentation

## 2019-12-22 MED ORDER — LIDOCAINE-EPINEPHRINE (PF) 2 %-1:200000 IJ SOLN
10.0000 mL | Freq: Once | INTRAMUSCULAR | Status: AC
  Administered 2019-12-22: 10 mL
  Filled 2019-12-22: qty 10

## 2019-12-22 MED ORDER — TETANUS-DIPHTH-ACELL PERTUSSIS 5-2.5-18.5 LF-MCG/0.5 IM SUSP
0.5000 mL | Freq: Once | INTRAMUSCULAR | Status: AC
  Administered 2019-12-22: 04:00:00 0.5 mL via INTRAMUSCULAR
  Filled 2019-12-22: qty 0.5

## 2019-12-22 NOTE — ED Triage Notes (Signed)
Pt reports that he was locked out of his apartment so he was trying to open the window and it broke. He presents with a laceration to his later and his medial right wrist. Bleeding controlled.

## 2019-12-22 NOTE — ED Provider Notes (Addendum)
Roberto Moody Provider Note   CSN: [id number] Arrival date & time: 12/22/19  0342   History Chief Complaint  Patient presents with  . Laceration    Roberto Moody is a 34 y.o. male.  The history is provided by the patient.  Laceration He was trying to open a window to get into his loft apartment when the window broke causing lacerations to his right hand.  He does not know when his last tetanus immunization was.  He denies other injury.  Past Medical History:  Diagnosis Date  . Bronchitis   . GERD (gastroesophageal reflux disease)   . Multiple allergies     Patient Active Problem List   Diagnosis Date Noted  . Sinus pain 09/29/2013  . URI (upper respiratory infection) 09/29/2013    Past Surgical History:  Procedure Laterality Date  . ADENOIDECTOMY    . HERNIA REPAIR    . tubes in ears         Family History  Problem Relation Age of Onset  . Cancer Mother        lung    Social History   Tobacco Use  . Smoking status: Passive Smoke Exposure - Never Smoker  . Smokeless tobacco: Never Used  Substance Use Topics  . Alcohol use: No  . Drug use: No    Home Medications Prior to Admission medications   Medication Sig Start Date End Date Taking? Authorizing Provider  azithromycin (ZITHROMAX) 250 MG tablet Take 1 tablet (250 mg total) by mouth daily. Take first 2 tablets together, then 1 every day until finished. Patient not taking: Reported on 05/31/2019 02/21/15   Patel-Mills, Orvil Feil, PA-C  benzonatate (TESSALON) 100 MG capsule Take 1 capsule (100 mg total) by mouth 3 (three) times daily as needed for cough. Patient not taking: Reported on 05/31/2019 06/11/15   Horton, Barbette Hair, MD  diphenhydrAMINE (BENADRYL) 25 MG tablet Take 1 tablet (25 mg total) by mouth every 6 (six) hours as needed for allergies. Patient taking differently: Take 12.5-25 mg by mouth every 6 (six) hours as needed for allergies.  03/14/14   Sciacca, Marissa, PA-C   famotidine (PEPCID) 10 MG tablet Take 10 mg by mouth daily as needed for heartburn or indigestion.     [provider]  famotidine (PEPCID) 20 MG tablet Take 1 tablet (20 mg total) by mouth 2 (two) times daily. Patient not taking: Reported on 05/31/2019 03/14/14   Sciacca, Marissa, PA-C  fluticasone (FLONASE) 50 MCG/ACT nasal spray Place 1 spray into both nostrils daily. Patient not taking: Reported on 05/31/2019 06/11/15   Horton, Barbette Hair, MD  HYDROcodone-homatropine [facility]) 5-1.5 MG/5ML syrup Take 5 mLs by mouth every 6 (six) hours as needed for cough. Patient not taking: Reported on 05/31/2019 02/21/15   Patel-Mills, Orvil Feil, PA-C  ibuprofen (ADVIL,MOTRIN) 800 MG tablet Take 1 tablet (800 mg total) by mouth every 8 (eight) hours as needed. Patient not taking: Reported on 05/31/2019 01/11/14   Hairford, Tyler Pita, MD  predniSONE (DELTASONE) 20 MG tablet Take 2 tablets (40 mg total) by mouth daily. 06/01/19   Julianne Rice, MD  sodium chloride (OCEAN) 0.65 % SOLN nasal spray Place 1 spray into both nostrils as needed for congestion. Patient not taking: Reported on 05/31/2019 06/11/15   Horton, Barbette Hair, MD    Allergies    Bee venom and Shellfish allergy  Review of Systems   Review of Systems  All other systems reviewed and are negative.   Physical Exam  Updated Vital Signs BP 138/78 (BP Location: Left Arm)   Pulse 78   Temp 98.1 F (36.7 C) (Oral)   Resp 15   Ht 6\' 3"  (1.905 m)   Wt 106.6 kg   SpO2 99%   BMI 29.37 kg/m   Physical Exam Vitals and nursing note reviewed.   34 year old male, resting comfortably and in no acute distress. Vital signs are normal. Oxygen saturation is 99%, which is normal. Head is normocephalic and atraumatic. PERRLA, EOMI. Oropharynx is clear. Neck is nontender and supple without adenopathy or JVD. Back is nontender and there is no CVA tenderness. Lungs are clear without rales, wheezes, or rhonchi. Chest is nontender. Heart has regular rate  and rhythm without murmur. Abdomen is soft, flat, nontender without masses or hepatosplenomegaly and peristalsis is normoactive. Extremities: Lacerations are present at the base of the thenar eminence of the right thumb, and across the ulnar aspect of the flexor surface of the right wrist.  Neurovascular and tendon function is normal. Skin is warm and dry without rash. Neurologic: Mental status is normal, cranial nerves are intact, there are no motor or sensory deficits.    ED Results / Procedures / Treatments    Procedures .31Laceration Repair  Date/Time: 12/22/2019 5:00 AM Performed by: 02/21/2020, MD Authorized by: Dione Booze, MD   Consent:    Consent obtained:  Verbal   Consent given by:  Patient   Risks discussed:  Infection, pain, retained foreign body and poor cosmetic result   Alternatives discussed:  No treatment Anesthesia (see MAR for exact dosages):    Anesthesia method:  Local infiltration   Local anesthetic:  Lidocaine 2% WITH epi Laceration details:    Location:  Hand   Hand location:  R palm   Length (cm):  3   Depth (mm):  2 Repair type:    Repair type:  Intermediate (align flaps) Pre-procedure details:    Preparation:  Patient was prepped and draped in usual sterile fashion Exploration:    Hemostasis achieved with:  Direct pressure   Wound exploration: entire depth of wound probed and visualized     Wound extent: no foreign bodies/material noted     Contaminated: no   Treatment:    Area cleansed with:  Saline   Amount of cleaning:  Standard Skin repair:    Repair method:  Sutures   Suture size:  4-0   Suture material:  Nylon   Suture technique:  Simple interrupted (Plastic-type suture to align flaps)   Number of sutures:  6 Approximation:    Approximation:  Close Post-procedure details:    Dressing:  Antibiotic ointment and sterile dressing   Patient tolerance of procedure:  Tolerated well, no immediate complications .Dione BoozeLaceration Repair   Date/Time: 12/22/2019 5:10 AM Performed by: 02/21/2020, MD Authorized by: Dione Booze, MD   Consent:    Consent obtained:  Verbal   Consent given by:  Patient   Risks discussed:  Infection, pain, poor cosmetic result and retained foreign body   Alternatives discussed:  No treatment Anesthesia (see MAR for exact dosages):    Anesthesia method:  Local infiltration   Local anesthetic:  Lidocaine 2% WITH epi Laceration details:    Location:  Shoulder/arm   Shoulder/arm location:  R lower arm   Length (cm):  2.5   Depth (mm):  2 Repair type:    Repair type:  Simple Pre-procedure details:    Preparation:  Patient was prepped and draped in usual sterile  fashion Exploration:    Hemostasis achieved with:  Direct pressure   Wound exploration: entire depth of wound probed and visualized     Wound extent: no foreign bodies/material noted     Contaminated: no   Treatment:    Area cleansed with:  Saline   Amount of cleaning:  Standard Skin repair:    Repair method:  Sutures   Suture size:  4-0   Suture material:  Nylon   Suture technique:  Running   Number of sutures:  1 Approximation:    Approximation:  Close Post-procedure details:    Dressing:  Antibiotic ointment and sterile dressing   Patient tolerance of procedure:  Tolerated well, no immediate complications    Medications Ordered in ED Medications  Tdap (BOOSTRIX) injection 0.5 mL (has no administration in time range)  lidocaine-EPINEPHrine (XYLOCAINE W/EPI) 2 %-1:200000 (PF) injection 10 mL (has no administration in time range)    ED Course  I have reviewed the triage vital signs and the nursing notes.  Pertinent labs & imaging results that were available during my care of the patient were reviewed by me and considered in my medical decision making (see chart for details).  MDM Rules/Calculators/A&P Lacerations of the right hand and wrist closed with sutures.  Tdap booster is given.  Old records are reviewed, and I  can find no record of prior tetanus immunization in our system.  Final Clinical Impression(s) / ED Diagnoses Final diagnoses:  Laceration of right hand without foreign body, initial encounter  Laceration of right wrist without foreign body, initial encounter    Rx / DC Orders ED Discharge Orders    None       Dione Booze, MD 12/22/19 0515    Dione Booze, MD 12/22/19 [phone]

## 2020-01-01 ENCOUNTER — Emergency Department (HOSPITAL_COMMUNITY): Admission: EM | Admit: 2020-01-01 | Discharge: 2020-01-01 | Discharge status: Absent without leave | Payer: Self-pay

## 2020-01-03 ENCOUNTER — Other Ambulatory Visit: Payer: Self-pay

## 2020-01-03 ENCOUNTER — Ambulatory Visit (HOSPITAL_COMMUNITY)
Admission: EM | Admit: 2020-01-03 | Discharge: 2020-01-03 | Disposition: A | Discharge status: Home / Self Care | Payer: Self-pay

## 2020-01-03 DIAGNOSIS — Z4802 Encounter for removal of sutures: Secondary | ICD-10-CM

## 2020-01-03 NOTE — ED Notes (Signed)
Patient presents to have sutures removed. Sutures placed on 12/22/19.  Removed a total of 6 sutures from lateral right wrist. Removed 1 continuous suture from medial right wrist. Patient tolerated procedure well.

## 2020-01-09 ENCOUNTER — Encounter (HOSPITAL_COMMUNITY): Payer: Self-pay | Admitting: Emergency Medicine

## 2020-01-09 ENCOUNTER — Emergency Department (HOSPITAL_COMMUNITY)
Admission: EM | Admit: 2020-01-09 | Discharge: 2020-01-09 | Disposition: A | Discharge status: Home / Self Care | Payer: Self-pay | Attending: Emergency Medicine | Admitting: Emergency Medicine

## 2020-01-09 ENCOUNTER — Other Ambulatory Visit: Payer: Self-pay

## 2020-01-09 DIAGNOSIS — J029 Acute pharyngitis, unspecified: Secondary | ICD-10-CM | POA: Insufficient documentation

## 2020-01-09 DIAGNOSIS — R6889 Other general symptoms and signs: Secondary | ICD-10-CM

## 2020-01-09 DIAGNOSIS — M791 Myalgia, unspecified site: Secondary | ICD-10-CM

## 2020-01-09 DIAGNOSIS — R5383 Other fatigue: Secondary | ICD-10-CM | POA: Insufficient documentation

## 2020-01-09 DIAGNOSIS — M7918 Myalgia, other site: Secondary | ICD-10-CM | POA: Insufficient documentation

## 2020-01-09 NOTE — Discharge Instructions (Signed)
Take Tylenol and ibuprofen as needed for pain.  Sure to drink plenty of fluids and rest.  Return for any worsening symptoms.

## 2020-01-09 NOTE — ED Provider Notes (Signed)
[facility]-EMERGENCY DEPT Provider Note   CSN: [id number] Arrival date & time: 01/09/20  1336     History Chief Complaint  Patient presents with  . Sore Throat  . Fatigue    Roberto Moody is a 34 y.o. male with past history significant for GERD, seasonal allergies who presents for evaluation of multiple complaints.  Patient states he received his first dose of the Pfizer Covid vaccine yesterday.  Patient states since then he has had left arm pain at the injection site, myalgias, sore throat.  He is able to tolerate p.o. intake at home without difficulty.  Denies any drooling, dysphagia or trismus.  No fever, chills, nausea, vomiting, lightheadedness, chest pain, shortness of breath, abdominal pain, diarrhea, dysuria.  Denies aggravating relieving factors.  Took 1 dose of Tylenol yesterday which helped with his symptoms however is not take anything since.  Denies additional aggravating or alleviating factors.  History obtained from patient and past medical record.  No interpreter used.  HPI     Past Medical History:  Diagnosis Date  . Bronchitis   . GERD (gastroesophageal reflux disease)   . Multiple allergies     Patient Active Problem List   Diagnosis Date Noted  . Sinus pain 09/29/2013  . URI (upper respiratory infection) 09/29/2013    Past Surgical History:  Procedure Laterality Date  . ADENOIDECTOMY    . HERNIA REPAIR    . tubes in ears         Family History  Problem Relation Age of Onset  . Cancer Mother        lung    Social History   Tobacco Use  . Smoking status: Passive Smoke Exposure - Never Smoker  . Smokeless tobacco: Never Used  Substance Use Topics  . Alcohol use: No  . Drug use: No    Home Medications Prior to Admission medications   Medication Sig Start Date End Date Taking? Authorizing Provider  diphenhydrAMINE (BENADRYL) 25 MG tablet Take 1 tablet (25 mg total) by mouth every 6 (six) hours as needed for  allergies. Patient taking differently: Take 12.5-25 mg by mouth every 6 (six) hours as needed for allergies.  03/14/14   Sciacca, Marissa, PA-C  famotidine (PEPCID) 10 MG tablet Take 10 mg by mouth daily as needed for heartburn or indigestion.     [provider]  fluticasone (FLONASE) 50 MCG/ACT nasal spray Place 1 spray into both nostrils daily. Patient not taking: Reported on 05/31/2019 06/11/15 12/22/19  Horton, Mayer Masker, MD  sodium chloride (OCEAN) 0.65 % SOLN nasal spray Place 1 spray into both nostrils as needed for congestion. Patient not taking: Reported on 05/31/2019 06/11/15 12/22/19  Shon Baton, MD    Allergies    Bee venom and Shellfish allergy  Review of Systems   Review of Systems  Constitutional: Positive for appetite change and fatigue. Negative for activity change, chills, diaphoresis and fever.  HENT: Positive for sore throat. Negative for congestion, drooling, ear pain, facial swelling, postnasal drip, rhinorrhea, sinus pressure, sinus pain, sneezing, trouble swallowing and voice change.   Respiratory: Negative.   Cardiovascular: Negative.   Gastrointestinal: Negative.   Genitourinary: Negative.   Musculoskeletal: Positive for myalgias. Negative for arthralgias, back pain, gait problem, joint swelling, neck pain and neck stiffness.  Skin: Negative.   Neurological: Negative.   All other systems reviewed and are negative.   Physical Exam Updated Vital Signs BP (!) 157/71   Pulse 78   Temp 99.3  F (37.4 C) (Oral)   Resp 16   SpO2 100%   Physical Exam Vitals and nursing note reviewed.  Constitutional:      General: He is not in acute distress.    Appearance: He is well-developed. He is not ill-appearing, toxic-appearing or diaphoretic.  HENT:     Head: Normocephalic and atraumatic.     Jaw: There is normal jaw occlusion.     Right Ear: Tympanic membrane, ear canal and external ear normal. There is no impacted cerumen. No hemotympanum. Tympanic  membrane is not injected, scarred, perforated, erythematous, retracted or bulging.     Left Ear: Tympanic membrane, ear canal and external ear normal. There is no impacted cerumen. No hemotympanum. Tympanic membrane is not injected, scarred, perforated, erythematous, retracted or bulging.     Ears:     Comments: No Mastoid tenderness.    Nose: No congestion or rhinorrhea.     Comments: Clear rhinorrhea and congestion to bilateral nares.  No sinus tenderness.    Mouth/Throat:     Tonsils: No tonsillar exudate or tonsillar abscesses. 0 on the right. 0 on the left.     Comments: Posterior oropharynx clear.  Mucous membranes moist.  Tonsils without erythema or exudate.  Uvula midline without deviation.  No evidence of PTA or RPA.  No drooling, dysphasia or trismus.  Phonation normal. Neck:     Trachea: Trachea and phonation normal.     Meningeal: Brudzinski's sign and Kernig's sign absent.     Comments: No Neck stiffness or neck rigidity.  No meningismus.  No cervical lymphadenopathy. Cardiovascular:     Heart sounds: Normal heart sounds.     Comments: No murmurs rubs or gallops. Pulmonary:     Effort: Pulmonary effort is normal.     Breath sounds: Normal breath sounds.     Comments: Clear to auscultation bilaterally without wheeze, rhonchi or rales.  No accessory muscle usage.  Able speak in full sentences. Abdominal:     General: Bowel sounds are normal.     Palpations: Abdomen is soft.     Comments: Soft, nontender without rebound or guarding.  No CVA tenderness.  Musculoskeletal:     Cervical back: Normal range of motion and neck supple.     Comments: Moves all 4 extremities without difficulty.  Lower extremities without edema, erythema or warmth.  Skin:    General: Skin is warm and dry.     Capillary Refill: Capillary refill takes less than 2 seconds.     Comments: Brisk capillary refill.  No rashes or lesions.  Neurological:     General: No focal deficit present.     Mental Status:  He is alert.     Comments: Ambulatory in department without difficulty.  Cranial nerves II through XII grossly intact.  No facial droop.  No aphasia.    ED Results / Procedures / Treatments   Labs (all labs ordered are listed, but only abnormal results are displayed) Labs Reviewed - No data to display  EKG None  Radiology No results found.  Procedures Procedures (including critical care time)  Medications Ordered in ED Medications - No data to display  ED Course  I have reviewed the triage vital signs and the nursing notes.  Pertinent labs & imaging results that were available during my care of the patient were reviewed by me and considered in my medical decision making (see chart for details).  34 year old male patient otherwise well presents for multiple complaints after receiving Covid  vaccine.  He is afebrile, nonseptic, non-ill-appearing.  Patient with pain at the injection site however no evidence of allergic reaction on exam.  Also with diffuse myalgias.  I have low suspicion for rhabdomyolysis.  Patient also with sore throat.  Posterior oropharynx clear.  Mucous membranes moist.  No evidence of PTA or RPA.  No drooling, dysphagia or trismus.  Tolerating p.o. intake at home.  Low suspicion for acute bacterial infection, sepsis, metabolic crisis, neurologic complication from vaccine.  Shared decision making with patient for strep test.  Patient deferred at this time which I feel is reasonable given exam.  Likely side effects from vaccine.  He appears overall well.  Discussed symptomatic management.  He is to return for any worsening symptoms.  The patient has been appropriately medically screened and/or stabilized in the ED. I have low suspicion for any other emergent medical condition which would require further screening, evaluation or treatment in the ED or require inpatient management.    MDM Rules/Calculators/A&P                      Roberto Moody was evaluated in  Emergency Department on 01/09/2020 for the symptoms described in the history of present illness. He was evaluated in the context of the global COVID-19 pandemic, which necessitated consideration that the patient might be at risk for infection with the SARS-CoV-2 virus that causes COVID-19. Institutional protocols and algorithms that pertain to the evaluation of patients at risk for COVID-19 are in a state of rapid change based on information released by regulatory bodies including the CDC and federal and state organizations. These policies and algorithms were followed during the patient's care in the ED. Final Clinical Impression(s) / ED Diagnoses Final diagnoses:  Feeling unwell  Sore throat  Myalgia    Rx / DC Orders ED Discharge Orders    None       Nohemi Nicklaus A, PA-C 01/09/20 1431    Lucrezia Starch, MD 01/09/20 [phone]

## 2020-01-09 NOTE — ED Triage Notes (Signed)
Pt reports he got his first covid vaccine yesterday. Having left arm pains that got shot in, feeling fatigued, not good and scratchy throat.

## 2020-07-09 ENCOUNTER — Other Ambulatory Visit: Payer: Self-pay | Admitting: Internal Medicine

## 2020-07-09 ENCOUNTER — Other Ambulatory Visit: Payer: Self-pay

## 2020-07-09 DIAGNOSIS — Z20822 Contact with and (suspected) exposure to covid-19: Secondary | ICD-10-CM

## 2020-07-11 LAB — SPECIMEN STATUS REPORT

## 2020-07-11 LAB — SARS-COV-2, NAA 2 DAY TAT

## 2020-07-11 LAB — NOVEL CORONAVIRUS, NAA: SARS-CoV-2, NAA: NOT DETECTED

## 2020-10-20 ENCOUNTER — Other Ambulatory Visit: Payer: Self-pay

## 2020-10-20 ENCOUNTER — Ambulatory Visit (HOSPITAL_COMMUNITY)
Admission: EM | Admit: 2020-10-20 | Discharge: 2020-10-20 | Disposition: A | Discharge status: Home / Self Care | Payer: PRIVATE HEALTH INSURANCE | Attending: Family Medicine | Admitting: Family Medicine

## 2020-10-20 ENCOUNTER — Encounter (HOSPITAL_COMMUNITY): Payer: Self-pay | Admitting: Emergency Medicine

## 2020-10-20 ENCOUNTER — Emergency Department (HOSPITAL_COMMUNITY)
Admission: EM | Admit: 2020-10-20 | Discharge: 2020-10-20 | Discharge status: Against Medical Advice | Payer: PRIVATE HEALTH INSURANCE

## 2020-10-20 DIAGNOSIS — U071 COVID-19: Secondary | ICD-10-CM | POA: Diagnosis not present

## 2020-10-20 LAB — SARS CORONAVIRUS 2 (TAT 6-24 HRS): SARS Coronavirus 2: POSITIVE — AB

## 2020-10-20 MED ORDER — ACETAMINOPHEN 325 MG PO TABS
ORAL_TABLET | ORAL | Status: AC
  Filled 2020-10-20: qty 2

## 2020-10-20 MED ORDER — ACETAMINOPHEN 325 MG PO TABS
650.0000 mg | ORAL_TABLET | Freq: Once | ORAL | Status: AC
  Administered 2020-10-20: 650 mg via ORAL

## 2020-10-20 NOTE — Discharge Instructions (Addendum)
Go home to rest Drink plenty of fluids Take Tylenol for pain or fever You may take over-the-counter cough and cold medicines as needed You must quarantine at home until your test result is available You can check for your test result in MyChart  

## 2020-10-20 NOTE — ED Provider Notes (Signed)
[facility]    CSN: [id number] Arrival date & time: 10/20/20  0805      History   Chief Complaint Chief Complaint  Patient presents with  . Nasal Congestion  . Headache    HPI Roberto Moody is a 35 y.o. male.   HPI  Girlfriend has COVID Patient has become progressively more sick for 3 days with cough and head congestion.  No chest pain SOB or cough Has had one pfizer vaccine Had COVID in 2020 Headache and body aches Chills, unaware of fever-temp now is 102    Past Medical History:  Diagnosis Date  . Bronchitis   . GERD (gastroesophageal reflux disease)   . Multiple allergies     Patient Active Problem List   Diagnosis Date Noted  . Sinus pain 09/29/2013  . URI (upper respiratory infection) 09/29/2013    Past Surgical History:  Procedure Laterality Date  . ADENOIDECTOMY    . HERNIA REPAIR    . tubes in ears         Home Medications    Prior to Admission medications   Medication Sig Start Date End Date Taking? Authorizing Provider  diphenhydrAMINE (BENADRYL) 25 MG tablet Take 1 tablet (25 mg total) by mouth every 6 (six) hours as needed for allergies. Patient taking differently: Take 12.5-25 mg by mouth every 6 (six) hours as needed for allergies. 03/14/14   Sciacca, Marissa, PA-C  famotidine (PEPCID) 10 MG tablet Take 10 mg by mouth daily as needed for heartburn or indigestion.     [provider]  fluticasone (FLONASE) 50 MCG/ACT nasal spray Place 1 spray into both nostrils daily. Patient not taking: Reported on 05/31/2019 06/11/15 12/22/19  Horton, Mayer Masker, MD  sodium chloride (OCEAN) 0.65 % SOLN nasal spray Place 1 spray into both nostrils as needed for congestion. Patient not taking: Reported on 05/31/2019 06/11/15 12/22/19  Horton, Mayer Masker, MD    Family History Family History  Problem Relation Age of Onset  . Cancer Mother        lung    Social History Social History   Tobacco Use  . Smoking status: Passive Smoke  Exposure - Never Smoker  . Smokeless tobacco: Never Used  Substance Use Topics  . Alcohol use: No  . Drug use: No     Allergies   Bee venom and Shellfish allergy   Review of Systems Review of Systems See HPI  Physical Exam Triage Vital Signs ED Triage Vitals  Enc Vitals Group     BP 10/20/20 0851 (!) 137/47     Pulse Rate 10/20/20 0851 (!) 120     Resp 10/20/20 0851 17     Temp 10/20/20 0851 (!) 102 F (38.9 C)     Temp Source 10/20/20 0851 Oral     SpO2 10/20/20 0851 95 %     Weight 10/20/20 0849 210 lb (95.3 kg)     Height 10/20/20 0849 6\' 3"  (1.905 m)     Head Circumference --      Peak Flow --      Pain Score 10/20/20 0849 8     Pain Loc --      Pain Edu? --      Excl. in GC? --    No data found.  Updated Vital Signs BP (!) 137/47 (BP Location: Right Arm)   Pulse (!) 120   Temp (!) 102 F (38.9 C) (Oral)   Resp 17   Ht 6\' 3"  (1.905 m)  Wt 95.3 kg   SpO2 95%   BMI 26.25 kg/m       Physical Exam Constitutional:      General: He is not in acute distress.    Appearance: He is well-developed and well-nourished. He is ill-appearing.  HENT:     Head: Normocephalic and atraumatic.     Nose: Congestion present.     Mouth/Throat:     Mouth: Oropharynx is clear and moist. Mucous membranes are moist.  Eyes:     Conjunctiva/sclera: Conjunctivae normal.     Pupils: Pupils are equal, round, and reactive to light.  Cardiovascular:     Rate and Rhythm: Normal rate and regular rhythm.     Heart sounds: Normal heart sounds.  Pulmonary:     Effort: Pulmonary effort is normal. No respiratory distress.     Breath sounds: Normal breath sounds.  Abdominal:     Palpations: Abdomen is soft.  Musculoskeletal:        General: No edema. Normal range of motion.     Cervical back: Normal range of motion.  Skin:    General: Skin is warm.     Comments: damp  Neurological:     General: No focal deficit present.     Mental Status: He is alert.  Psychiatric:         Behavior: Behavior normal.      UC Treatments / Results  Labs (all labs ordered are listed, but only abnormal results are displayed) Labs Reviewed - No data to display  EKG   Radiology No results found.  Procedures Procedures (including critical care time)  Medications Ordered in UC Medications  acetaminophen (TYLENOL) tablet 650 mg (650 mg Oral Given 10/20/20 0859)    Initial Impression / Assessment and Plan / UC Course  I have reviewed the triage vital signs and the nursing notes.  Pertinent labs & imaging results that were available during my care of the patient were reviewed by me and considered in my medical decision making (see chart for details).     With covid exposure ans fever and tachycardia, think COVID is likely diagnosis Final Clinical Impressions(s) / UC Diagnoses   Final diagnoses:  COVID     Discharge Instructions     Go home to rest Drink plenty of fluids Take Tylenol for pain or fever You may take over-the-counter cough and cold medicines as needed You must quarantine at home until your test result is available You can check for your test result in MyChart    ED Prescriptions    None     PDMP not reviewed this encounter.   Eustace Moore, MD 10/20/20 [phone]

## 2020-10-20 NOTE — ED Notes (Signed)
Pt went to UC, will be removed from system.

## 2020-10-20 NOTE — ED Triage Notes (Signed)
Patient c/o headache, chills, nasal congestion, and non-productive cough x 3 days.   Patient is unaware of temperature status at home.   Patient was recently around a COVID-19 positive individual.   Patient hasn't taken any medication for symptoms.

## 2023-07-12 ENCOUNTER — Telehealth: Payer: PRIVATE HEALTH INSURANCE | Admitting: Physician Assistant

## 2023-07-12 DIAGNOSIS — J208 Acute bronchitis due to other specified organisms: Secondary | ICD-10-CM

## 2023-07-12 DIAGNOSIS — B9689 Other specified bacterial agents as the cause of diseases classified elsewhere: Secondary | ICD-10-CM | POA: Diagnosis not present

## 2023-07-12 MED ORDER — FLUTICASONE PROPIONATE 50 MCG/ACT NA SUSP: 2.0000 | Freq: Every day | NASAL | 0 refills | Status: DC

## 2023-07-12 MED ORDER — AZITHROMYCIN 250 MG PO TABS: ORAL_TABLET | ORAL | 0 refills | Status: AC

## 2023-07-12 MED ORDER — BENZONATATE 100 MG PO CAPS: 100.0000 mg | ORAL_CAPSULE | Freq: Three times a day (TID) | ORAL | 0 refills | Status: DC | PRN

## 2023-07-12 NOTE — Progress Notes (Signed)
Virtual Visit Consent   Roberto Moody, you are scheduled for a virtual visit with a [facility] provider today. Just as with appointments in the office, your consent must be obtained to participate. Your consent will be active for this visit and any virtual visit you may have with one of our providers in the next 365 days. If you have a MyChart account, a copy of this consent can be sent to you electronically.  As this is a virtual visit, video technology does not allow for your provider to perform a traditional examination. This may limit your provider's ability to fully assess your condition. If your provider identifies any concerns that need to be evaluated in person or the need to arrange testing (such as labs, EKG, etc.), we will make arrangements to do so. Although advances in technology are sophisticated, we cannot ensure that it will always work on either your end or our end. If the connection with a video visit is poor, the visit may have to be switched to a telephone visit. With either a video or telephone visit, we are not always able to ensure that we have a secure connection.  By engaging in this virtual visit, you consent to the provision of healthcare and authorize for your insurance to be billed (if applicable) for the services provided during this visit. Depending on your insurance coverage, you may receive a charge related to this service.  I need to obtain your verbal consent now. Are you willing to proceed with your visit today? Roberto Moody has provided verbal consent on 07/12/2023 for a virtual visit (video or telephone). Roberto Moody, [place]  Date: 07/12/2023 7:03 PM  Virtual Visit via Video Note   I, Roberto Moody, connected with  Roberto Moody  ([id number], [birth year]) on 07/12/23 at  7:00 PM EDT by a video-enabled telemedicine application and verified that I am speaking with the correct person using two identifiers.  Location: Patient: Virtual Visit Location  Patient: Home Provider: Virtual Visit Location Provider: Home Office   I discussed the limitations of evaluation and management by telemedicine and the availability of in person appointments. The patient expressed understanding and agreed to proceed.    History of Present Illness: Roberto Moody is a 37 y.o. who identifies as a male who was assigned male at birth, and is being seen today for concern of potential ear infection. Endorses symptoms starting in the past 2 days with initial sinus pressure and R ear pressure, now with consistent R ear pain and mild decrease in hearing and mild tinnitus. Notes borderline fever.  IS associated with R adenopathy. Also noting this was preceded by development of cough and chest congestion last week that has now become productive of thick, colored phlegm.   HPI: HPI  Problems:  Patient Active Problem List   Diagnosis Date Noted   Sinus pain 09/29/2013   URI (upper respiratory infection) 09/29/2013    Allergies:  Allergies  Allergen Reactions   Bee Venom Anaphylaxis   Shellfish Allergy Anaphylaxis    Throat swells   Medications:  Current Outpatient Medications:    azithromycin (ZITHROMAX) 250 MG tablet, Take 2 tablets on day 1, then 1 tablet daily on days 2 through 5, Disp: 6 tablet, Rfl: 0   benzonatate (TESSALON) 100 MG capsule, Take 1 capsule (100 mg total) by mouth 3 (three) times daily as needed for cough., Disp: 30 capsule, Rfl: 0   fluticasone (FLONASE) 50 MCG/ACT nasal spray, Place 2 sprays into both nostrils  daily., Disp: 16 g, Rfl: 0  Observations/Objective: Patient is well-developed, well-nourished in no acute distress.  Resting comfortably at home.  Head is normocephalic, atraumatic.  No labored breathing. Speech is clear and coherent with logical content.  Patient is alert and oriented at baseline.   Assessment and Plan: 1. Acute bacterial bronchitis - fluticasone (FLONASE) 50 MCG/ACT nasal spray; Place 2 sprays into both  nostrils daily.  Dispense: 16 g; Refill: 0 - benzonatate (TESSALON) 100 MG capsule; Take 1 capsule (100 mg total) by mouth 3 (three) times daily as needed for cough.  Dispense: 30 capsule; Refill: 0 - azithromycin (ZITHROMAX) 250 MG tablet; Take 2 tablets on day 1, then 1 tablet daily on days 2 through 5  Dispense: 6 tablet; Refill: 0  With Eustachian tube dysfunction and concern for start of AOM. Supportive measures and OTC medications reviewed. Tessalon, Flonase and Azithromycin per orders.  Follow Up Instructions: I discussed the assessment and treatment plan with the patient. The patient was provided an opportunity to ask questions and all were answered. The patient agreed with the plan and demonstrated an understanding of the instructions.  A copy of instructions were sent to the patient via MyChart unless otherwise noted below.   The patient was advised to call back or seek an in-person evaluation if the symptoms worsen or if the condition fails to improve as anticipated.  Time:  I spent 10 minutes with the patient via telehealth technology discussing the above problems/concerns.    Roberto Climes, PA-C

## 2023-07-12 NOTE — Patient Instructions (Signed)
Mliss Fritz, thank you for joining Piedad Climes, PA-C for today's virtual visit.  While this provider is not your primary care provider (PCP), if your PCP is located in our provider database this encounter information will be shared with them immediately following your visit.   A [facility] MyChart account gives you access to today's visit and all your visits, tests, and labs performed at [facility] " click here if you don't have a [facility] MyChart account or go to [email or web address]  Consent: (Patient) Burnet Alen provided verbal consent for this virtual visit at the beginning of the encounter.  Current Medications:  Current Outpatient Medications:    diphenhydrAMINE (BENADRYL) 25 MG tablet, Take 1 tablet (25 mg total) by mouth every 6 (six) hours as needed for allergies. (Patient taking differently: Take 12.5-25 mg by mouth every 6 (six) hours as needed for allergies.), Disp: 20 tablet, Rfl: 0   famotidine (PEPCID) 10 MG tablet, Take 10 mg by mouth daily as needed for heartburn or indigestion. , Disp: , Rfl:    Medications ordered in this encounter:  No orders of the defined types were placed in this encounter.    *If you need refills on other medications prior to your next appointment, please contact your pharmacy*  Follow-Up: Call back or seek an in-person evaluation if the symptoms worsen or if the condition fails to improve as anticipated.  [facility] [phone]  Other Instructions Take antibiotic (Azithromycin) as directed.  Increase fluids.  Get plenty of rest. Use Mucinex for congestion. Use the Tessalon and Flonase as directed. Take a daily probiotic (I recommend Align or Culturelle, but even Activia Yogurt may be beneficial).  A humidifier placed in the bedroom may offer some relief for a dry, scratchy throat of nasal irritation.  Read information below on acute bronchitis. Please call or return to clinic if symptoms are  not improving.  Acute Bronchitis Bronchitis is when the airways that extend from the windpipe into the lungs get red, puffy, and painful (inflamed). Bronchitis often causes thick spit (mucus) to develop. This leads to a cough. A cough is the most common symptom of bronchitis. In acute bronchitis, the condition usually begins suddenly and goes away over time (usually in 2 weeks). Smoking, allergies, and asthma can make bronchitis worse. Repeated episodes of bronchitis may cause more lung problems.  HOME CARE Rest. Drink enough fluids to keep your pee (urine) clear or pale yellow (unless you need to limit fluids as told by your doctor). Only take over-the-counter or prescription medicines as told by your doctor. Avoid smoking and secondhand smoke. These can make bronchitis worse. If you are a smoker, think about using nicotine gum or skin patches. Quitting smoking will help your lungs heal faster. Reduce the chance of getting bronchitis again by: Washing your hands often. Avoiding people with cold symptoms. Trying not to touch your hands to your mouth, nose, or eyes. Follow up with your doctor as told.  GET HELP IF: Your symptoms do not improve after 1 week of treatment. Symptoms include: Cough. Fever. Coughing up thick spit. Body aches. Chest congestion. Chills. Shortness of breath. Sore throat.  GET HELP RIGHT AWAY IF:  You have an increased fever. You have chills. You have severe shortness of breath. You have bloody thick spit (sputum). You throw up (vomit) often. You lose too much body fluid (dehydration). You have a severe headache. You faint.  MAKE SURE YOU:  Understand these instructions. Will watch your  condition. Will get help right away if you are not doing well or get worse. Document Released: 03/21/2008 Document Revised: 06/05/2013 Document Reviewed: 03/26/2013 [facility] Patient Information 2015 [place], [place]. This information is not intended to replace advice  given to you by your health care provider. Make sure you discuss any questions you have with your health care provider.    If you have been instructed to have an in-person evaluation today at a local Urgent Care facility, please use the link below. It will take you to a list of all of our available [facility], including address, phone number and hours of operation. Please do not delay care.  [facility]  If you or a family member do not have a primary care provider, use the link below to schedule a visit and establish care. When you choose a [facility] primary care physician or advanced practice provider, you gain a long-term partner in health. Find a Primary Care Provider  Learn more about [facility]'s in-office and virtual care options: [facility] - Get Care Now

## 2023-08-01 ENCOUNTER — Encounter (HOSPITAL_COMMUNITY): Payer: Self-pay | Admitting: *Deleted

## 2023-08-01 ENCOUNTER — Ambulatory Visit (HOSPITAL_COMMUNITY)
Admission: EM | Admit: 2023-08-01 | Discharge: 2023-08-01 | Disposition: A | Discharge status: Home / Self Care | Payer: Commercial Managed Care - PPO | Attending: Emergency Medicine | Admitting: Emergency Medicine

## 2023-08-01 DIAGNOSIS — H66001 Acute suppurative otitis media without spontaneous rupture of ear drum, right ear: Secondary | ICD-10-CM | POA: Diagnosis not present

## 2023-08-01 MED ORDER — AMOXICILLIN-POT CLAVULANATE 875-125 MG PO TABS: 1.0000 | ORAL_TABLET | Freq: Two times a day (BID) | ORAL | 0 refills | Status: AC

## 2023-08-01 NOTE — Discharge Instructions (Addendum)
Start taking Augmentin twice daily for 7 days for ear infection.  You can alternate between Tylenol and Profen as needed for pain and fever.  Keep your appointment with your primary care doctor this Friday to follow-up and discuss blood pressure management.  Return here as needed.

## 2023-08-01 NOTE — ED Triage Notes (Signed)
Pt states he has had right ear pain x 1 month. He had a virtual visit was given antibiotics he states the pain never went away. He is using ear drops OTC  He has a pcp appt Friday to est care and discuss BP.

## 2023-08-01 NOTE — ED Provider Notes (Signed)
[facility]    CSN: [id number] Arrival date & time: 08/01/23  1244      History   Chief Complaint Chief Complaint  Patient presents with   Otalgia    HPI Roberto Moody is a 37 y.o. male.   Patient presents with right ear pain x 1 month.  Patient had a virtual visit for upper respiratory symptoms and was given azithromycin for bronchitis on 9/25.  Patient denies known fever, shortness of breath, chest pain, dizziness, headaches, and blurred vision.   Otalgia Associated symptoms: congestion and rhinorrhea   Associated symptoms: no cough, no fever and no headaches     Past Medical History:  Diagnosis Date   Bronchitis    GERD (gastroesophageal reflux disease)    Multiple allergies     Patient Active Problem List   Diagnosis Date Noted   Sinus pain 09/29/2013   URI (upper respiratory infection) 09/29/2013    Past Surgical History:  Procedure Laterality Date   ADENOIDECTOMY     HERNIA REPAIR     tubes in ears         Home Medications    Prior to Admission medications   Medication Sig Start Date End Date Taking? Authorizing Provider  amoxicillin-clavulanate (AUGMENTIN) 875-125 MG tablet Take 1 tablet by mouth every 12 (twelve) hours. 08/01/23  Yes Letta Kocher, NP    Family History Family History  Problem Relation Age of Onset   Lung cancer Mother     Social History Social History   Tobacco Use   Smoking status: Passive Smoke Exposure - Never Smoker   Smokeless tobacco: Never  Vaping Use   Vaping status: Never Used  Substance Use Topics   Alcohol use: No   Drug use: No     Allergies   Bee venom and Shellfish allergy   Review of Systems Review of Systems  Constitutional:  Negative for chills, fatigue and fever.  HENT:  Positive for congestion, ear pain and rhinorrhea.   Eyes:  Negative for visual disturbance.  Respiratory:  Negative for cough, chest tightness and shortness of breath.   Cardiovascular:  Negative for  chest pain.  Neurological:  Negative for dizziness and headaches.     Physical Exam Triage Vital Signs ED Triage Vitals  Encounter Vitals Group     BP 08/01/23 1302 (!) 168/102     Systolic BP Percentile --      Diastolic BP Percentile --      Pulse Rate 08/01/23 1302 74     Resp 08/01/23 1302 18     Temp 08/01/23 1302 99.1 F (37.3 C)     Temp Source 08/01/23 1302 Oral     SpO2 08/01/23 1302 96 %     Weight --      Height --      Head Circumference --      Peak Flow --      Pain Score 08/01/23 1301 7     Pain Loc --      Pain Education --      Exclude from Growth Chart --    No data found.  Updated Vital Signs BP (!) 168/102 (BP Location: Left Arm)   Pulse 74   Temp 99.1 F (37.3 C) (Oral)   Resp 18   SpO2 96%   Visual Acuity Right Eye Distance:   Left Eye Distance:   Bilateral Distance:    Right Eye Near:   Left Eye Near:    Bilateral Near:  Physical Exam Vitals and nursing note reviewed.  Constitutional:      General: He is awake. He is not in acute distress.    Appearance: Normal appearance. He is well-developed and well-groomed. He is not ill-appearing.  HENT:     Right Ear: Ear canal and external ear normal. Tympanic membrane is erythematous and retracted.     Left Ear: Tympanic membrane, ear canal and external ear normal.     Nose: Congestion and rhinorrhea present.     Right Sinus: No maxillary sinus tenderness or frontal sinus tenderness.     Left Sinus: No maxillary sinus tenderness or frontal sinus tenderness.     Mouth/Throat:     Mouth: Mucous membranes are moist.     Pharynx: Posterior oropharyngeal erythema and postnasal drip present. No pharyngeal swelling or oropharyngeal exudate.     Tonsils: No tonsillar exudate.  Neurological:     General: No focal deficit present.     Mental Status: He is alert and oriented to person, place, and time.     GCS: GCS eye subscore is 4. GCS verbal subscore is 5. GCS motor subscore is 6.   Psychiatric:        Behavior: Behavior is cooperative.      UC Treatments / Results  Labs (all labs ordered are listed, but only abnormal results are displayed) Labs Reviewed - No data to display  EKG   Radiology No results found.  Procedures Procedures (including critical care time)  Medications Ordered in UC Medications - No data to display  Initial Impression / Assessment and Plan / UC Course  I have reviewed the triage vital signs and the nursing notes.  Pertinent labs & imaging results that were available during my care of the patient were reviewed by me and considered in my medical decision making (see chart for details).     Patient presented with right ear pain x 1 month.  Patient was recently prescribed azithromycin for bronchitis via virtual visit.  Upon assessment patient has erythematous and retracted right TM.  Mild erythema noted to oropharynx with postnasal drip, congestion and rhinorrhea present.  Patient's blood pressure is 160/102 in clinic.  No neurodeficits upon assessment.  GCS 15.  Patient states he has an appointment with primary care this Friday to address blood pressure.  Prescribed Augmentin for otitis media.  Discussed follow-up and return precautions. Final Clinical Impressions(s) / UC Diagnoses   Final diagnoses:  Non-recurrent acute suppurative otitis media of right ear without spontaneous rupture of tympanic membrane     Discharge Instructions      Start taking Augmentin twice daily for 7 days for ear infection.  You can alternate between Tylenol and Profen as needed for pain and fever.  Keep your appointment with your primary care doctor this Friday to follow-up and discuss blood pressure management.  Return here as needed.    ED Prescriptions     Medication Sig Dispense Auth. Provider   amoxicillin-clavulanate (AUGMENTIN) 875-125 MG tablet Take 1 tablet by mouth every 12 (twelve) hours. 14 tablet Wynonia Lawman A, NP      PDMP  not reviewed this encounter.   Wynonia Lawman A, NP 08/01/23 [phone]
